# Patient Record
Sex: Female | Born: 1935 | Race: White | Hispanic: No | Smoking: Never smoker
Health system: Southern US, Community
[De-identification: ages and names within clinical notes are randomized; demographics above are authoritative.]

## PROBLEM LIST (undated history)

## (undated) DIAGNOSIS — F319 Bipolar disorder, unspecified: Secondary | ICD-10-CM

## (undated) DIAGNOSIS — K219 Gastro-esophageal reflux disease without esophagitis: Secondary | ICD-10-CM

## (undated) DIAGNOSIS — M1990 Unspecified osteoarthritis, unspecified site: Secondary | ICD-10-CM

## (undated) DIAGNOSIS — F0390 Unspecified dementia without behavioral disturbance: Secondary | ICD-10-CM

## (undated) DIAGNOSIS — F209 Schizophrenia, unspecified: Secondary | ICD-10-CM

## (undated) DIAGNOSIS — I872 Venous insufficiency (chronic) (peripheral): Secondary | ICD-10-CM

## (undated) DIAGNOSIS — E785 Hyperlipidemia, unspecified: Secondary | ICD-10-CM

## (undated) DIAGNOSIS — G20 Parkinson's disease: Secondary | ICD-10-CM

## (undated) DIAGNOSIS — M810 Age-related osteoporosis without current pathological fracture: Secondary | ICD-10-CM

## (undated) DIAGNOSIS — R251 Tremor, unspecified: Secondary | ICD-10-CM

## (undated) DIAGNOSIS — E119 Type 2 diabetes mellitus without complications: Secondary | ICD-10-CM

## (undated) DIAGNOSIS — F419 Anxiety disorder, unspecified: Secondary | ICD-10-CM

## (undated) DIAGNOSIS — E876 Hypokalemia: Secondary | ICD-10-CM

## (undated) DIAGNOSIS — D649 Anemia, unspecified: Secondary | ICD-10-CM

## (undated) DIAGNOSIS — G309 Alzheimer's disease, unspecified: Secondary | ICD-10-CM

## (undated) DIAGNOSIS — I10 Essential (primary) hypertension: Secondary | ICD-10-CM

## (undated) DIAGNOSIS — F0280 Dementia in other diseases classified elsewhere without behavioral disturbance: Secondary | ICD-10-CM

## (undated) DIAGNOSIS — K5900 Constipation, unspecified: Secondary | ICD-10-CM

## (undated) HISTORY — DX: Venous insufficiency (chronic) (peripheral): I87.2

## (undated) HISTORY — DX: Alzheimer's disease, unspecified: G30.9

## (undated) HISTORY — DX: Dementia in other diseases classified elsewhere, unspecified severity, without behavioral disturbance, psychotic disturbance, mood disturbance, and anxiety: F02.80

## (undated) HISTORY — DX: Anemia, unspecified: D64.9

## (undated) HISTORY — DX: Parkinson's disease: G20

## (undated) HISTORY — DX: Type 2 diabetes mellitus without complications: E11.9

## (undated) HISTORY — DX: Gastro-esophageal reflux disease without esophagitis: K21.9

## (undated) HISTORY — DX: Unspecified osteoarthritis, unspecified site: M19.90

## (undated) HISTORY — DX: Anxiety disorder, unspecified: F41.9

## (undated) HISTORY — DX: Hypokalemia: E87.6

## (undated) HISTORY — DX: Constipation, unspecified: K59.00

## (undated) HISTORY — DX: Schizophrenia, unspecified: F20.9

## (undated) HISTORY — DX: Bipolar disorder, unspecified: F31.9

## (undated) HISTORY — DX: Age-related osteoporosis without current pathological fracture: M81.0

## (undated) HISTORY — DX: Tremor, unspecified: R25.1

## (undated) HISTORY — DX: Hyperlipidemia, unspecified: E78.5

---

## 2000-06-14 ENCOUNTER — Emergency Department (HOSPITAL_COMMUNITY): Admission: EM | Admit: 2000-06-14 | Discharge: 2000-06-14 | Payer: Self-pay | Admitting: Emergency Medicine

## 2003-01-30 ENCOUNTER — Ambulatory Visit (HOSPITAL_COMMUNITY): Admission: RE | Admit: 2003-01-30 | Discharge: 2003-01-30 | Payer: Self-pay | Admitting: Internal Medicine

## 2003-01-30 ENCOUNTER — Emergency Department (HOSPITAL_COMMUNITY): Admission: EM | Admit: 2003-01-30 | Discharge: 2003-01-30 | Payer: Self-pay | Admitting: Emergency Medicine

## 2007-06-28 ENCOUNTER — Emergency Department (HOSPITAL_COMMUNITY): Admission: EM | Admit: 2007-06-28 | Discharge: 2007-06-28 | Payer: Self-pay | Admitting: Emergency Medicine

## 2008-02-20 ENCOUNTER — Encounter (INDEPENDENT_AMBULATORY_CARE_PROVIDER_SITE_OTHER): Payer: Self-pay | Admitting: Internal Medicine

## 2008-02-20 ENCOUNTER — Ambulatory Visit: Payer: Self-pay | Admitting: Cardiovascular Disease

## 2008-02-20 ENCOUNTER — Observation Stay (HOSPITAL_COMMUNITY): Admission: EM | Admit: 2008-02-20 | Discharge: 2008-02-21 | Payer: Self-pay | Admitting: Emergency Medicine

## 2008-06-04 ENCOUNTER — Inpatient Hospital Stay (HOSPITAL_COMMUNITY): Admission: AD | Admit: 2008-06-04 | Discharge: 2008-06-04 | Payer: Self-pay | Admitting: Obstetrics & Gynecology

## 2008-08-27 ENCOUNTER — Emergency Department (HOSPITAL_COMMUNITY): Admission: EM | Admit: 2008-08-27 | Discharge: 2008-08-27 | Payer: Self-pay | Admitting: Emergency Medicine

## 2008-09-19 ENCOUNTER — Inpatient Hospital Stay (HOSPITAL_COMMUNITY): Admission: EM | Admit: 2008-09-19 | Discharge: 2008-09-22 | Payer: Self-pay | Admitting: Emergency Medicine

## 2010-05-08 LAB — BASIC METABOLIC PANEL
BUN: 24 mg/dL — ABNORMAL HIGH (ref 6–23)
BUN: 8 mg/dL (ref 6–23)
CO2: 23 mEq/L (ref 19–32)
CO2: 23 mEq/L (ref 19–32)
CO2: 23 mEq/L (ref 19–32)
CO2: 24 mEq/L (ref 19–32)
CO2: 25 mEq/L (ref 19–32)
Calcium: 7.8 mg/dL — ABNORMAL LOW (ref 8.4–10.5)
Calcium: 7.9 mg/dL — ABNORMAL LOW (ref 8.4–10.5)
Calcium: 8 mg/dL — ABNORMAL LOW (ref 8.4–10.5)
Calcium: 8.3 mg/dL — ABNORMAL LOW (ref 8.4–10.5)
Chloride: 108 mEq/L (ref 96–112)
Chloride: 112 mEq/L (ref 96–112)
Chloride: 119 mEq/L — ABNORMAL HIGH (ref 96–112)
Chloride: 120 mEq/L — ABNORMAL HIGH (ref 96–112)
Creatinine, Ser: 1 mg/dL (ref 0.4–1.2)
Creatinine, Ser: 1.05 mg/dL (ref 0.4–1.2)
Creatinine, Ser: 1.13 mg/dL (ref 0.4–1.2)
GFR calc Af Amer: 38 mL/min — ABNORMAL LOW (ref >60)
GFR calc Af Amer: 50 mL/min — ABNORMAL LOW (ref >60)
GFR calc Af Amer: 56 mL/min — ABNORMAL LOW (ref >60)
GFR calc Af Amer: >60 mL/min (ref >60)
GFR calc Af Amer: >60 mL/min (ref >60)
GFR calc Af Amer: >60 mL/min (ref >60)
GFR calc Af Amer: >60 mL/min (ref >60)
GFR calc non Af Amer: 31 mL/min — ABNORMAL LOW (ref >60)
GFR calc non Af Amer: 36 mL/min — ABNORMAL LOW (ref >60)
GFR calc non Af Amer: 38 mL/min — ABNORMAL LOW (ref >60)
GFR calc non Af Amer: 41 mL/min — ABNORMAL LOW (ref >60)
GFR calc non Af Amer: 50 mL/min — ABNORMAL LOW (ref >60)
GFR calc non Af Amer: 53 mL/min — ABNORMAL LOW (ref >60)
Glucose, Bld: 234 mg/dL — ABNORMAL HIGH (ref 70–99)
Glucose, Bld: 254 mg/dL — ABNORMAL HIGH (ref 70–99)
Glucose, Bld: 332 mg/dL — ABNORMAL HIGH (ref 70–99)
Glucose, Bld: 361 mg/dL — ABNORMAL HIGH (ref 70–99)
Potassium: 2.9 mEq/L — ABNORMAL LOW (ref 3.5–5.1)
Potassium: 3.4 mEq/L — ABNORMAL LOW (ref 3.5–5.1)
Potassium: 4.2 mEq/L (ref 3.5–5.1)
Potassium: 4.4 mEq/L (ref 3.5–5.1)
Potassium: 4.6 mEq/L (ref 3.5–5.1)
Sodium: 136 mEq/L (ref 135–145)
Sodium: 138 mEq/L (ref 135–145)
Sodium: 141 mEq/L (ref 135–145)
Sodium: 141 mEq/L (ref 135–145)
Sodium: 148 mEq/L — ABNORMAL HIGH (ref 135–145)
Sodium: 149 mEq/L — ABNORMAL HIGH (ref 135–145)
Sodium: 150 mEq/L — ABNORMAL HIGH (ref 135–145)
Sodium: 153 mEq/L — ABNORMAL HIGH (ref 135–145)
Sodium: 155 mEq/L — ABNORMAL HIGH (ref 135–145)

## 2010-05-08 LAB — TSH: TSH: 2.681 u[IU]/mL (ref 0.350–4.500)

## 2010-05-08 LAB — GLUCOSE, CAPILLARY
Glucose-Capillary: 154 mg/dL — ABNORMAL HIGH (ref 70–99)
Glucose-Capillary: 180 mg/dL — ABNORMAL HIGH (ref 70–99)
Glucose-Capillary: 183 mg/dL — ABNORMAL HIGH (ref 70–99)
Glucose-Capillary: 214 mg/dL — ABNORMAL HIGH (ref 70–99)
Glucose-Capillary: 220 mg/dL — ABNORMAL HIGH (ref 70–99)
Glucose-Capillary: 251 mg/dL — ABNORMAL HIGH (ref 70–99)
Glucose-Capillary: 269 mg/dL — ABNORMAL HIGH (ref 70–99)
Glucose-Capillary: 278 mg/dL — ABNORMAL HIGH (ref 70–99)
Glucose-Capillary: 280 mg/dL — ABNORMAL HIGH (ref 70–99)
Glucose-Capillary: 294 mg/dL — ABNORMAL HIGH (ref 70–99)
Glucose-Capillary: 294 mg/dL — ABNORMAL HIGH (ref 70–99)
Glucose-Capillary: 337 mg/dL — ABNORMAL HIGH (ref 70–99)
Glucose-Capillary: 341 mg/dL — ABNORMAL HIGH (ref 70–99)
Glucose-Capillary: 349 mg/dL — ABNORMAL HIGH (ref 70–99)
Glucose-Capillary: 351 mg/dL — ABNORMAL HIGH (ref 70–99)
Glucose-Capillary: 362 mg/dL — ABNORMAL HIGH (ref 70–99)
Glucose-Capillary: 363 mg/dL — ABNORMAL HIGH (ref 70–99)
Glucose-Capillary: 390 mg/dL — ABNORMAL HIGH (ref 70–99)
Glucose-Capillary: 444 mg/dL — ABNORMAL HIGH (ref 70–99)

## 2010-05-08 LAB — COMPREHENSIVE METABOLIC PANEL
ALT: 12 U/L (ref 0–35)
AST: 14 U/L (ref 0–37)
Alkaline Phosphatase: 124 U/L — ABNORMAL HIGH (ref 39–117)
CO2: 28 mEq/L (ref 19–32)
Calcium: 9.1 mg/dL (ref 8.4–10.5)
GFR calc Af Amer: 35 mL/min — ABNORMAL LOW (ref >60)
GFR calc non Af Amer: 29 mL/min — ABNORMAL LOW (ref >60)
Potassium: 5.3 mEq/L — ABNORMAL HIGH (ref 3.5–5.1)
Sodium: 154 mEq/L — ABNORMAL HIGH (ref 135–145)
Total Protein: 7 g/dL (ref 6.0–8.3)

## 2010-05-08 LAB — URINALYSIS, ROUTINE W REFLEX MICROSCOPIC
Glucose, UA: 100 mg/dL — AB
Ketones, ur: 15 mg/dL — AB
Protein, ur: 30 mg/dL — AB

## 2010-05-08 LAB — DIFFERENTIAL
Eosinophils Absolute: 0.7 10*3/uL (ref 0.0–0.7)
Eosinophils Relative: 6 % — ABNORMAL HIGH (ref 0–5)
Lymphs Abs: 3.4 10*3/uL (ref 0.7–4.0)
Monocytes Relative: 7 % (ref 3–12)

## 2010-05-08 LAB — CBC
Hemoglobin: 10.5 g/dL — ABNORMAL LOW (ref 12.0–15.0)
MCHC: 32.4 g/dL (ref 30.0–36.0)
RBC: 3.56 MIL/uL — ABNORMAL LOW (ref 3.87–5.11)
RBC: 4.39 MIL/uL (ref 3.87–5.11)
WBC: 9.9 10*3/uL (ref 4.0–10.5)

## 2010-05-08 LAB — PROTIME-INR
Prothrombin Time: 13.8 seconds (ref 11.6–15.2)
Prothrombin Time: 15.3 seconds — ABNORMAL HIGH (ref 11.6–15.2)

## 2010-05-08 LAB — LIPID PANEL
Cholesterol: 187 mg/dL (ref 0–200)
Total CHOL/HDL Ratio: 4.7 RATIO

## 2010-05-08 LAB — URINE MICROSCOPIC-ADD ON

## 2010-05-08 LAB — VALPROIC ACID LEVEL: Valproic Acid Lvl: 43.6 ug/mL — ABNORMAL LOW (ref 50.0–100.0)

## 2010-05-08 LAB — CARDIAC PANEL(CRET KIN+CKTOT+MB+TROPI)
CK, MB: 1.5 ng/mL (ref 0.3–4.0)
Total CK: 142 U/L (ref 7–177)

## 2010-05-11 LAB — WET PREP, GENITAL: Clue Cells Wet Prep HPF POC: NONE SEEN

## 2010-05-17 LAB — BASIC METABOLIC PANEL
BUN: 24 mg/dL — ABNORMAL HIGH (ref 6–23)
BUN: 59 mg/dL — ABNORMAL HIGH (ref 6–23)
Chloride: 112 mEq/L (ref 96–112)
Creatinine, Ser: 0.65 mg/dL (ref 0.4–1.2)
GFR calc non Af Amer: >60 mL/min (ref >60)
GFR calc non Af Amer: >60 mL/min (ref >60)
Glucose, Bld: 166 mg/dL — ABNORMAL HIGH (ref 70–99)
Potassium: 3.7 mEq/L (ref 3.5–5.1)
Potassium: 3.8 mEq/L (ref 3.5–5.1)
Sodium: 147 mEq/L — ABNORMAL HIGH (ref 135–145)

## 2010-05-17 LAB — DIFFERENTIAL
Eosinophils Absolute: 0.2 10*3/uL (ref 0.0–0.7)
Eosinophils Relative: 2 % (ref 0–5)
Lymphocytes Relative: 35 % (ref 12–46)
Lymphs Abs: 3.8 10*3/uL (ref 0.7–4.0)
Monocytes Absolute: 0.6 10*3/uL (ref 0.1–1.0)
Monocytes Relative: 6 % (ref 3–12)

## 2010-05-17 LAB — CBC
HCT: 34.7 % — ABNORMAL LOW (ref 36.0–46.0)
Hemoglobin: 11.2 g/dL — ABNORMAL LOW (ref 12.0–15.0)
MCV: 88.8 fL (ref 78.0–100.0)
WBC: 10.7 10*3/uL — ABNORMAL HIGH (ref 4.0–10.5)

## 2010-05-17 LAB — POCT CARDIAC MARKERS
CKMB, poc: <1 ng/mL — ABNORMAL LOW (ref 1.0–8.0)
CKMB, poc: <1 ng/mL — ABNORMAL LOW (ref 1.0–8.0)
Myoglobin, poc: 125 ng/mL (ref 12–200)
Troponin i, poc: <0.05 ng/mL (ref 0.00–0.09)
Troponin i, poc: <0.05 ng/mL (ref 0.00–0.09)

## 2010-06-15 NOTE — H&P (Signed)
NAME:  Marisa Scott, Marisa Scott NO.:  [id number]   MEDICAL RECORD NO.:  [id number]          PATIENT TYPE:  EMS   LOCATION:  MAJO                         FACILITY:  [facility]   PHYSICIAN:  Eduard Clos, MDDATE OF BIRTH:  1929/11/29   DATE OF ADMISSION:  09/19/2008  DATE OF DISCHARGE:                              HISTORY & PHYSICAL   PRIMARY CARE PHYSICIAN:  Dr. Lenon Curt. Chilton Si, [employer].   CHIEF COMPLAINT:  Most of the history is obtained from the emergency  room physician, as the patient is a poor candidate for any history.  The  chief complaint is abnormal labs.   HISTORY OF PRESENT ILLNESS:  This 77 year old female with a history of  advanced dementia, hyperlipidemia, diabetes mellitus, type 2 and  schizophrenia was brought into the emergency room.  The patient was  found to have a high sodium of 154, which was recently done, as the  patient had followup with her primary care physician.  This was  reconfirmed in the emergency room, which was found to be around 153.  The patient is already started on IV fluids.  Initially her potassium  was found to be around 5.24, which Kayexalate was given, along with some  insulin and D-50.  Presently the potassium is about 4.4.  The patient  has been admitted for further evaluation and management of her  dehydration.   As the patient has advanced dementia, she does not provide any history.  She does follow commands.  There is no note mentioning any diarrhea or  abdominal pain, nausea, vomiting, fever or chills.  No loss of  consciousness.  The patient will be admitted for IV hydration and close  observation.   PAST MEDICAL HISTORY:  1. Advanced dementia.  2. Schizophrenia.  3. Bipolar disorder.  4. Anxiety disorder.  5. Hypertension.  6. Diabetes mellitus, type 2.   MEDICATIONS PRIOR TO ADMISSION:  1. Lotensin 20 mg p.o. daily.  2. Free water four times daily.  3. Depakote 750 in the morning and 1000at  bedtime.  4. Abilify 20 mg p.o. daily in the a.m.  5. Os-Cal-D 500 mg p.o. b.i.d.  6. Namenda 10 mg p.o. b.i.d.  7. Primidone 50 mg p.o. t.i.d.  8. ProCel two scoops t.i.d.  9. Aricept 10 mg p.o. at bedtime.  10.Zocor 40 mg p.o. daily.  11.Zantac 25 mg p.o. at bedtime.  12.Lantus insulin 40 units at bedtime.  13.Forteo 20 mcg daily.  14.NovoLog sliding scale.  15.Klonopin 0.25 mg p.o. b.i.d.   ALLERGIES:  NO KNOWN DRUG ALLERGIES.   SOCIAL HISTORY:  The patient has not history of tobacco abuse or drug  abuse or alcohol abuse.   FAMILY HISTORY:  Not obtainable at this time.   REVIEW OF SYSTEMS:  The patient is a poor historian.  Has advanced  dementia.   PHYSICAL EXAMINATION:  GENERAL:  The patient is not in acute distress  and follows commands.  Has tremors.  VITAL SIGNS:  Blood pressure 130/60, pulse 90 per minute, temperature  97.8 degrees, respirations 18 per minute, O2 saturation 98%.  HEENT:  Normocephalic and atraumatic.  Anicteric.  LUNGS:  Bilaterally clear to auscultation.  No rhonchi, no crepitation.  HEART:  S1, S2 heard.  ABDOMEN:  Soft, nontender.  bowel sounds heard.  CNS:  The patient is alert and awake.  Follows commands.  Moves upper  and lower extremities.  EXTREMITIES:  Peripheral pulses full.  No edema.   LABORATORY DATA:  CBC:  WBC 11.3, hemoglobin 14.5, hematocrit 38.7,  platelets 231.  Glucose 367.  Metabolic panel:  Sodium 153, potassium  4.4, chloride 133, carbon dioxide 21, glucose 367, BUN 59, creatinine  1.6.  Alkaline phosphatase 124, AST 14, ALT 12, total protein 7, albumin  3, calcium 9.  Urinalysis shows glucose 100, ketones 15.  The patient's  anion gap is 9.  Nitrites in the urine are negative.  Leukocytes are  negative in the urine, WBCs 0.   ASSESSMENT:  1. Severe dehydration.  2. Hypernatremia, secondary to dehydration.  3. History of schizophrenia, bipolar disorder, anxiety.  4. History of hypertension.  5. History of  hyperlipidemia.   PLAN:  1. Will admit the patient to telemetry.  Initially the patient was      found to have hyperkalemia, which is corrected.  2. Will continue with IV fluids, half normal saline at 200 mL.  The      patient is to have sodium and chloride now getting in character      with half normal.  Will have to change the D-5-W with increased      insulin requirement.  3. Will continue most of her medications at this time.  4. Will check BMETs every six hours.  Further recommendations as her      condition evolves.  If the patient's sodium gets corrected, the      patient may be able to go to a nursing home in the next 24 hours.      Eduard Clos, MD  Electronically Signed     ANK/MEDQ  D:  09/19/2008  T:  09/19/2008  Job:  [id number]   cc:   Lenon Curt. Chilton Si, M.D.

## 2010-06-15 NOTE — H&P (Signed)
NAME:  Marisa Scott, Marisa Scott NO.:  [id number]   MEDICAL RECORD NO.:  [id number]          PATIENT TYPE:  INP   LOCATION:  0101                         FACILITY:  [facility]   PHYSICIAN:  Vania Rea, M.D. DATE OF BIRTH:  1929-12-21   DATE OF ADMISSION:  02/20/2008  DATE OF DISCHARGE:                              HISTORY & PHYSICAL   PRIMARY CARE PHYSICIAN:  Dr. Merton Border Green/Dr. __________ at [facility].   CHIEF COMPLAINT:  History of syncope.   HISTORY OF PRESENT ILLNESS:  This is a 77 year old Caucasian lady with a  history of schizophrenia and advanced dementia and other medical  problems such as diabetes, who is a resident of a skilled nursing  facility.  She is usually medically stable, mobile by wheelchair.  Her  baseline is alert, but not oriented to place nor time.  Recently, the  patient was found to be markedly dehydrated, reportedly with a BUN  elevated over 90 and has been getting hydration.  She has therefore been  on IV fluids and has been getting checked q. shift at the skilled  nursing facility.  Nurses report that at the change of shift at about  11:40 p.m. last night, the new nurses went to check on her and found her  unresponsive, not breathing and without a pulse.  A code was called and  after 2 minutes of CPR, the patient began to stir and was moving her  arms.  Her vitals were checked at that time and she was found to have a  temperature of 97.4, a pulse of 82, respiration of 20 and a blood  pressure of 133/85.  When EMS arrived, they found the patient alert and  no signs of distress, but because of a report of 2 minutes of  unresponsiveness requiring CPR, the patient was transferred to the  emergency room.  The patient has been evaluated by the emergency room  physician.  No acute abnormalities have been found to explain her  syncope and the hospitalist service has been called to assist with  management.   PAST MEDICAL  HISTORY:  The patient is completely demented and unable to  contribute to the history.  She complains only of thirst.  Denies any  other problems.  Skilled nursing facility other than reporting that her  BUN/creatinine ratio is markedly elevated deny any other problems.   PAST MEDICAL HISTORY:  1. Schizophrenia.  2. Bipolar disorder.  3. Dementia.  4. Hyperlipidemia.  5. Diabetes type 2.  6. Osteoporosis.  7. Dehydration.   MEDICATIONS:  1. Abilify 35 mg daily.  2. Lasix 20 mg daily.  3. Benazepril 20 mg daily.  4. Senokot-S 2 tablets daily.  5. Multivitamins with mineral 1 daily.  6. Forteo 20 mcg subcu daily.  7. Depakote extended-release 750 mg each morning and 1000 mg at      bedtime.  8. Klonopin 0.25 mg twice daily.  9. Os-Cal with D 1 tablet twice daily.  10.Namenda 10 mg twice daily.  11.Aricept 10 mg at bedtime.  12.Primidone 50 mg three times daily.  13.Zocor  40 mg at bedtime.  14.Perphenazine 12 mg at bedtime.  15  Zantac 75 mg at bedtime.  1. Glucophage XR 1000 mg twice daily.  2. Lantus 35 units each morning.  3. Med Pass 90 mL three times daily.   ALLERGIES:  NO KNOWN DRUG ALLERGIES.   SOCIAL HISTORY:  No known history of alcohol, tobacco or illicit drug  use.  She is divorced.  She is a long-term resident of a current  facility.   FAMILY HISTORY:  We are unable to obtain.   REVIEW OF SYSTEMS:  Other than noted above, significant only for a past  history of tinea cruris, incontinence of urine, history of  hypernatremia, history of organic brain syndrome, history of tardive  dyskinesia, vasomotor rhinitis, GERD, constipation.  Past history of  psoriasis, osteoarthrosis of the knee, history of generalized pain, past  history of edema.   PHYSICAL EXAMINATION:  GENERAL:  A frail elderly Caucasian lady lying on  the stretcher, mouth open and dry, initially unresponsive when first  approached, but with stimuli arouses and is talkative, but is not  oriented  to place nor time, but sufficiently oriented to ask me if I am  the doctor.  VITAL SIGNS:  Her temperature is 97.7, pulses 91, respirations 18, blood  pressure 118/59.  She is saturating 100% on 2 liters.  She denies any  pain.  HEENT:  Her pupils are round and equal.  Mucous membranes pink and dry.  No oral lesions.  Her mouth oral mucosa is extremely dry.  NECK:  No cervical lymphadenopathy or thyromegaly.  CHEST:  Clear to auscultation bilaterally.  CARDIOVASCULAR:  Regular rhythm.  She has a 3/6 systolic murmur heard  all over the precordium and radiating through to her back.  ABDOMEN:  Soft and nontender.  There are no masses.  EXTREMITIES:  Without edema.  She has 2+ dorsalis pedis pulses  bilaterally.  She has bony deformity of both knees and bowing of the  tibia.  CENTRAL NERVOUS SYSTEM:  Cranial nerves II-XII are grossly intact.  She  has no lateralizing signs.   LABORATORY DATA:  CBC; her white count is slightly elevated 10.7,  hemoglobin is 11.2, which is up from a previous value earlier this  month.  Her platelets are 180.  She has a normal differential on her  white count.  Serum chemistry; her sodium is 147, potassium 3.7,  chloride 112, CO2 of 26, BUN 59, creatinine 0.88, glucose 111, calcium  8.8, beta type natriuretic peptide is 32.5.  Her cardiac enzymes are  remarkably normal for somebody who has had CPR.  Her CK-MB is  undetectable.  Her troponin is undetectable.  Her myoglobin is 125.  Point of care markers are within that same range.  Chest x-ray shows  mild to moderate bronchitic changes.  There are no acute infiltrates or  effusions.  EKG shows normal sinus rhythm with a rate of 87, and there  are no ST-segment changes to suggest ischemia.   ASSESSMENT:  1. Dehydration as assessed by clinical appearance and elevated      BUN/creatinine ratio.  2. Hypernatremia associated with dehydration  3. History of transient syncope, questionable true syncope versus  the      patient in deep sleep.  4. Diabetes type 2, apparent fair control.  5. History of hypertension with fair control.  6. History of osteoporosis.  7. Advanced dementia.  8. History of schizoaffective disorder.   PLAN:  Will admit this lady  for observation.  Will hydrate her with free  water while she is in hospital.  Will check a 2-D echo because of her  cardiac murmur.  Will monitor her cardiac enzymes.  The chances are if  this patient remains stable over the next 24 hours, she can be returned  to the emergency room with a presumed diagnosis of pseudosyncope.  Other  plans as per orders.      Vania Rea, M.D.  Electronically Signed     LC/MEDQ  D:  02/20/2008  T:  02/20/2008  Job:  [id number]

## 2010-06-15 NOTE — Discharge Summary (Signed)
NAME:  Marisa Scott, Marisa Scott NO.:  [id number]   MEDICAL RECORD NO.:  [id number]          PATIENT TYPE:  INP   LOCATION:  5524                         FACILITY:  [facility]   PHYSICIAN:  Richarda Overlie, MD       DATE OF BIRTH:  [birth year]   DATE OF ADMISSION:  09/19/2008  DATE OF DISCHARGE:                               DISCHARGE SUMMARY   DISCHARGE DIAGNOSIS:  1. Hypernatremia.  2. Dehydration secondary to decreased free water intake  3. Acute on chronic renal insufficiency.  4. Mild diabetic ketoacidosis.  5. Dementia.  6. Schizophrenia.  7. Bipolar disorder.  8. Anxiety disorder.  9. Uncontrolled diabetes.   DISCHARGE MEDICATIONS:  1. Abilify 20 mg p.o. daily.  2. Lotensin 20 mg p.o. daily.  3. Os-Cal 500 one tablet p.o. b.i.d.  4. Klonopin 0.25 mg p.o. twice a day.  5. Depakote 750 mg p.o. daily in the morning and 1000 mg p.o. daily at      bedtime.  6. Aricept 10 mg p.o. daily.  7. Free water 240 mL p.o. q.4 h.  8. NovoLog sliding scale.  9. Lantus 40 units twice a day.  10.Namenda 10 mg p.o. b.i.d.  11.Primidone 50 mg p.o. t.i.d.  12.Florastor 250 mg p.o. b.i.d.  13.Zocor 40 mg p.o. in the evening.  14.Tylenol 650 p.o. q.6 h. p.r.n.  15.Protein supplement 2 scoops 3 times a day.   PROCEDURES:  Chest x-ray portable shows left basal atelectasis.   LABORATORY DATA:  Lipid panel shows triglycerides of 287, LDL 90, HDL  40, TSH of 2.681.   Cardiac enzymes negative.   SUBJECTIVE:  This is a 77 year old female with a history of advanced  dementia, dyslipidemia, schizophrenia, anxiety disorder, type 2  diabetes, who presents to the ER with a chief complaint of altered  mental status and electrolyte abnormalities.  The patient apparently had  an elevated potassium of about 5.24 at the nursing home.  She was also  found to have sodium of 154.  She was also found to be confused at the  nursing home.  The patient was sent to the ER for further evaluation and  for  treatment of her electrolyte abnormalities.   HOSPITAL COURSE:  1. Electrolyte abnormalities.  The patient was found to have a CBG of      367, a potassium of about 5.24, creatinine of 1.61, and a sodium of      155 at the time of initial admission.  The patient initially was      treated with insulin, D50 and Kayexalate for her elevated      potassium.  The patient was also started on treatment with D5W for      her hypernatremia.  No obvious exacerbating cause was found.  It      was thought that the patient's significant electrolyte      abnormalities were probably present in the setting of her mild DKA.      The patient was treated for mild DKA and electrolyte abnormalities,      continued to improve; in fact, the patient's potassium came  down to      2.9 during the course of her hospitalization and potassium      supplementation was required.  The patient's sodium also improved      to about 141 on D5W half normal saline.  This was thought to be      secondary to free water deficit, and the parameters for free water      intake at the nursing home have been changed from 240 mL 3 times a      day to 240 mL 6 times a day.  2. Diabetes.  The patient was found to be in mild DKA with a mildly      low bicarb, presence of ketones on  urinalysis as well as a      borderline high anion gap.  The patient was placed on a glucose      stabilizer drip, following which her serial blood sugars improved.      Her Lantus is being increased to 40 units twice a day.  She will      continue on the NovoLog sliding scale as previously.  No obvious      exacerbating cause of her electrolyte abnormalities/mild DKA was      found.  The patient did not have any underlying signs of infection      despite a mildly-elevated white count of 11.3 at the time of      admission.  A urinalysis was done and was found to be negative.  A      chest x-ray was also found to be negative for any underlying      pneumonia.   The patient was initiated on a mechanical soft nectar-      thick diet with q.a.c. and q.h.s. coverage.  3. Underlying dementia, schizophrenia and anxiety disorder.  The      patient was continued on Abilify, Namenda, Aricept as previously      and no changes are being made.   FOLLOW-UP CONCERNS:  The patient's water intake needs to be optimized,  and this is being increased from 3 times a day to 6 times a day.  The  patient would also need a repeat BMP panel in about 5-7 days and also a  fasting glucose and a hemoglobin A1c in 3 months.      Richarda Overlie, MD  Electronically Signed     NA/MEDQ  D:  09/21/2008  T:  09/21/2008  Job:  [id number]

## 2010-06-15 NOTE — Discharge Summary (Signed)
NAME:  Marisa Scott, Marisa Scott NO.:  [id number]   MEDICAL RECORD NO.:  [id number]          PATIENT TYPE:  INP   LOCATION:  1428                         [facility]ACILITY[facility]   PHYSICIAN:  Theodosia Paling, MD    DATE OF BIRTH:  03-20-1929   DATE OF ADMISSION:  02/20/2008  DATE OF DISCHARGE:  02/21/2008                               DISCHARGE SUMMARY   PRIMARY CARE PHYSICIAN:  Dr. Murray Hodgkins at [facility].   ADMITTING HISTORY:  Please refer to the excellent admission note  dictated by Dr. Vania Rea dated February 20, 2008.   DISCHARGE DIAGNOSES:  1. Dehydration.  2. History of apparent cardiac arrest and resuscitation, 2 minutes      resuscitation prior to the discharge.  3. Diabetes mellitus.  4. Hypernatremia.  5. Hypertension.  6. Dementia.  7. Schizoaffective disorder.   DISCHARGE MEDICATIONS:  1. Abilify 35 mg p.o. daily.  2. Lasix 20 mg p.o. daily.  3. Benazepril 20 mg p.o. daily.  4. Senokot S 2 tablets p.o. daily.  5. Multivitamins and minerals one tab p.o. daily.  6. Forteo 20 mcg subcutaneous daily.  7. Depakote extended release 750 mg each morning and 1000 mg at      bedtime.  8. Klonopin 0.25 mg p.o. q.12 hours.  9. Os-Cal with D 1 tablet twice daily.  10.Namenda 10 mg p.o. q. 12 hours.  11.Aricept 10 mg p.o. nightly.  12.Primidone 50 mg p.o. q.8 hours.  13.Zocor 40 mg p.o. nightly.  14.Perphenazine 12 mg at bedtime.  15.Zantac 75 mg p.o. nightly.  16.Glucophage XR 1000 mg p.o. q. 12 hours.  17.Lantus 35 units each morning.  18.MedPass 90 mL p.o. q.8 hours.   HOSPITAL COURSE:  The following issues were addressed during  hospitalization:  1. Dehydration.  According to the admitting physician assessment the      patient appeared very dehydrated, had elevated BUN/creatinine      ratio.  She received hydration.  At the time of discharge      clinically she looks well, hydrated, has no electrolyte or renal      failure  associated with dehydration.  2. History of questionable cardiac arrest.  The patient was brought to      the hospital after 2 minutes of cardiac arrest.  She was admitted      under telemetry.  She did not go to the MICU for admission and she      was hemodynamically stable at the time of admission and Dr.      Orvan Falconer did not feel any need for patient to be monitored in the      intensive care given her normal vitals.  She was admitted in      telemetry which did not show any evidence of her arrhythmia.  She      stayed in normal sinus rhythm.  She received an echocardiogram      which showed normal ejection fraction and no wall motion defects.  3. Diabetes, stayed controlled.  4. Hypertension, stayed controlled.  5. Dementia, stable.  6. Schizoaffective  disorder, psychotropic medications continued.   CONSULTATION PERFORMED:  None.   PROCEDURE PERFORMED:  None.   Chest x-ray performed on February 20, 2008:  Mild-to-moderate bronchitis  changes.  Echocardiogram performed on July 20, 2008:  Showing overall  ejection fraction of left ventricle 60%, mild aortic valve stenosis.   Thank you so much for this dictation.  Total time spent in discharge of  the patient 45 minutes.      Theodosia Paling, MD  Electronically Signed     NP/MEDQ  D:  02/21/2008  T:  02/21/2008  Job:  [id number]   cc:   Lenon Curt. Chilton Si, M.D.  Fax: [phone]

## 2010-06-15 NOTE — Discharge Summary (Signed)
NAME:  Marisa Scott, BLANE NO.:  [id number]   MEDICAL RECORD NO.:  [id number]          PATIENT TYPE:  INP   LOCATION:  5524                         FACILITY:  [facility]   PHYSICIAN:  Richarda Overlie, MD       DATE OF BIRTH:  12/08/1929   DATE OF ADMISSION:  09/19/2008  DATE OF DISCHARGE:  09/22/2008                               DISCHARGE SUMMARY   DISCHARGE DIAGNOSIS/DISCHARGE MEDICATION LIST:  Joneen Roach see discharge  diagnosis and final discharge medication list from discharge summary  done on September 21, 2008.  No changes are being made at this time.   HOSPITAL COURSE OVERNIGHT:  The patient has been hemodynamically stable.   VITAL SIGNS AT THE TIME OF DISCHARGE:  130/63 blood pressure,  respirations 22, pulse 73, temperature 98.0.  GENERAL:  Alert but mildly confused, otherwise in no acute distress.  HEENT:  Pupils equal and reactive.  Extraocular movements intact.  LUNGS:  Clear to auscultation bilaterally.  No wheezes, no crackles, no  rhonchi.  CARDIOVASCULAR:  Regular rate and rhythm.  No appreciable murmurs, rubs  or gallops.  ABDOMEN:  Soft, nontender, nondistended.  NEUROLOGIC:  Cranial nerves II-XII grossly intact.   LABS:  Sodium 138, potassium 4.6, chloride 1.2, bicarbonate 20, glucose  254, BUN 8, creatinine 1.01.   ASSESSMENT AND PLAN:  1. Mild diabetic ketoacidosis.  This has resolved for the last 2 days.      The patient's Lantus has been increased to 40 units twice a day.      She is to continue with Accu-Cheks before meals and nightly.  She      will need a repeat CMP panel along with magnesium and a phosphorus      check in about for 4 days to make sure that her electrolyte      abnormalities stay resolved.  2. Dehydration and hypernatremia.  This has resolved and the patient's      sodium is stable.  Would encourage continued increase in free water      intake as was initiated in the hospital.  Otherwise, the patient is      stable at this time for  discharge.      Richarda Overlie, MD  Electronically Signed     NA/MEDQ  D:  09/22/2008  T:  09/22/2008  Job:  [id number]

## 2011-12-30 ENCOUNTER — Encounter (HOSPITAL_COMMUNITY): Payer: Self-pay | Admitting: Emergency Medicine

## 2011-12-30 ENCOUNTER — Emergency Department (HOSPITAL_COMMUNITY)
Admission: EM | Admit: 2011-12-30 | Discharge: 2011-12-31 | Disposition: A | Discharge status: Home / Self Care | Payer: Medicare Other | Attending: Emergency Medicine | Admitting: Emergency Medicine

## 2011-12-30 ENCOUNTER — Emergency Department (HOSPITAL_COMMUNITY): Payer: Medicare Other

## 2011-12-30 DIAGNOSIS — X58XXXA Exposure to other specified factors, initial encounter: Secondary | ICD-10-CM | POA: Insufficient documentation

## 2011-12-30 DIAGNOSIS — F0390 Unspecified dementia without behavioral disturbance: Secondary | ICD-10-CM | POA: Insufficient documentation

## 2011-12-30 DIAGNOSIS — Y9389 Activity, other specified: Secondary | ICD-10-CM | POA: Insufficient documentation

## 2011-12-30 DIAGNOSIS — S82402A Unspecified fracture of shaft of left fibula, initial encounter for closed fracture: Secondary | ICD-10-CM

## 2011-12-30 DIAGNOSIS — I10 Essential (primary) hypertension: Secondary | ICD-10-CM | POA: Insufficient documentation

## 2011-12-30 DIAGNOSIS — S82409A Unspecified fracture of shaft of unspecified fibula, initial encounter for closed fracture: Secondary | ICD-10-CM | POA: Insufficient documentation

## 2011-12-30 DIAGNOSIS — Z79899 Other long term (current) drug therapy: Secondary | ICD-10-CM | POA: Insufficient documentation

## 2011-12-30 DIAGNOSIS — Z794 Long term (current) use of insulin: Secondary | ICD-10-CM | POA: Insufficient documentation

## 2011-12-30 DIAGNOSIS — Y929 Unspecified place or not applicable: Secondary | ICD-10-CM | POA: Insufficient documentation

## 2011-12-30 HISTORY — DX: Unspecified dementia, unspecified severity, without behavioral disturbance, psychotic disturbance, mood disturbance, and anxiety: F03.90

## 2011-12-30 HISTORY — DX: Essential (primary) hypertension: I10

## 2011-12-30 NOTE — ED Notes (Signed)
Bed:WA12<BR> Expected date:<BR> Expected time:<BR> Means of arrival:<BR> Comments:<BR> EMS

## 2011-12-30 NOTE — ED Notes (Addendum)
Pt from [facility] c/o left foot pain. XRay done at facility confirmed left ankle fracture. Pt has no complaints. Sleeping on EMS arrival and during transport. Hx of dementia, schizophrenia, bipolar, anxiety, HTN. AAOx4 per norm. VS 130/80 HR 80 RR16 at 2300.

## 2011-12-30 NOTE — ED Notes (Signed)
No witnessed fall reported.

## 2011-12-31 NOTE — ED Notes (Signed)
[facility] called  

## 2011-12-31 NOTE — ED Notes (Addendum)
Pt has been in bed with eyes open, not verbally responsive, but alert.

## 2011-12-31 NOTE — ED Notes (Signed)
Pt back from X-ray.  

## 2011-12-31 NOTE — ED Provider Notes (Signed)
History     CSN: [id number]  Arrival date & time 12/30/11  2258   First MD Initiated Contact with Patient 12/30/11 2305      Chief Complaint  Patient presents with  . Left Ankle Fracture     (Consider location/radiation/quality/duration/timing/severity/associated sxs/prior treatment) HPI 78 year old female presents from nursing home with complaint of left ankle fracture. Patient has been complaining of left foot pain, had x-rays done at nursing facility. I spoke with the nursing staff at the facility, patient is not ambulatory, and they are unsure how she injured her ankle. Report with patient shows a spiral left fibula fracture. Patient has pain with palpation of the left ankle, but otherwise is asymptomatic. He should has dementia, schizophrenia. She has no complaints at this time  Past Medical History  Diagnosis Date  . Hypertension   . Dementia     History reviewed. No pertinent past surgical history.  No family history on file.  History  Substance Use Topics  . Smoking status: Not on file  . Smokeless tobacco: Not on file  . Alcohol Use:     OB History    Grav Para Term Preterm Abortions TAB SAB Ect Mult Living                  Review of Systems  Unable to perform ROS: Dementia    Allergies  Review of patient's allergies indicates no known allergies.  Home Medications   Current Outpatient Rx  Name  Route  Sig  Dispense  Refill  . BENAZEPRIL HCL 10 MG PO TABS   Oral   Take 10 mg by mouth daily.         Marland Kitchen CLONAZEPAM 0.5 MG PO TABS   Oral   Take 0.25 mg by mouth 2 (two) times daily as needed. anxiety         . UTI-STAT PO LIQD   Oral   Take 30 mLs by mouth every morning.         . DONEPEZIL HCL 10 MG PO TABS   Oral   Take 10 mg by mouth at bedtime.         Marland Kitchen FERROUS SULFATE 325 (65 FE) MG PO TABS   Oral   Take 325 mg by mouth daily with breakfast.         . INSULIN ASPART 100 UNIT/ML [place] SOLN   Subcutaneous   Inject 8 Units into  the skin 3 (three) times daily before meals. Sliding scale         . INSULIN GLARGINE 100 UNIT/ML [place] SOLN   Subcutaneous   Inject 40 Units into the skin every morning.         Marland Kitchen MEMANTINE HCL 10 MG PO TABS   Oral   Take 10 mg by mouth 2 (two) times daily.         Marland Kitchen ELDERTONIC PO   Oral   Take 15 mLs by mouth 3 (three) times daily.         . MULTI-VITAMIN/MINERALS PO TABS   Oral   Take 1 tablet by mouth daily.         Jeralyn Bennett CALCIUM 500 MG PO TABS   Oral   Take 500 mg of elemental calcium by mouth daily.         . INVEGA PO   Oral   Take 117 mg by mouth every 30 (thirty) days.         Frazier Butt FREE OP  Ophthalmic   Apply 1 drop to eye 3 (three) times daily.         Marland Kitchen POTASSIUM CHLORIDE CRYS ER 20 MEQ PO TBCR   Oral   Take 20 mEq by mouth daily.         Marland Kitchen PRIMIDONE 50 MG PO TABS   Oral   Take 50 mg by mouth 3 (three) times daily.         Marland Kitchen RISPERIDONE 1 MG PO TABS   Oral   Take 1 mg by mouth at bedtime.           BP 120/61  Pulse 88  Temp 98 F (36.7 C) (Oral)  Resp 18  SpO2 96%  Physical Exam  Nursing note and vitals reviewed. Constitutional: She appears well-developed and well-nourished. No distress.  Cardiovascular: Normal rate, regular rhythm, normal heart sounds and intact distal pulses.  Exam reveals no gallop and no friction rub.   No murmur heard. Pulmonary/Chest: Effort normal and breath sounds normal. No respiratory distress. She has no wheezes. She has no rales. She exhibits no tenderness.  Musculoskeletal: She exhibits tenderness (Tenderness to palpation of left ankle,lateral worse than medial without crepitus. Soft tissue swelling noted).  Skin: Skin is warm and dry. No rash noted. No erythema. No pallor.    ED Course  Procedures (including critical care time)  Labs Reviewed - No data to display Dg Ankle Complete Left  12/31/2011  *RADIOLOGY REPORT*  Clinical Data: Left ankle injury.  LEFT ANKLE COMPLETE - 3+ VIEW   Comparison: None.  Findings: Minimally displaced oblique fracture through the distal fibula present with overlying soft tissue swelling.  There is irregularity of the medial malleolus without definite acute fracture.  This may relate to prior injury.  IMPRESSION: Minimally displaced fracture of the distal fibula.  Irregularity of the medial malleolus is felt to most likely relate to old injury.   Original Report Authenticated By: Irish Lack, M.D.      1. Closed fracture of left fibula       MDM  78 year old female with distal fibula fracture. 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 Will get x-rays here, and placed in splint to followup outpatient orthopedics.        Olivia Mackie, MD 12/31/11 [phone]

## 2012-06-12 ENCOUNTER — Non-Acute Institutional Stay (SKILLED_NURSING_FACILITY): Payer: Medicare Other | Admitting: Internal Medicine

## 2012-06-12 DIAGNOSIS — E7800 Pure hypercholesterolemia, unspecified: Secondary | ICD-10-CM

## 2012-06-12 DIAGNOSIS — IMO0001 Reserved for inherently not codable concepts without codable children: Secondary | ICD-10-CM

## 2012-06-12 DIAGNOSIS — G309 Alzheimer's disease, unspecified: Secondary | ICD-10-CM

## 2012-06-12 DIAGNOSIS — I10 Essential (primary) hypertension: Secondary | ICD-10-CM

## 2012-06-12 DIAGNOSIS — F0280 Dementia in other diseases classified elsewhere without behavioral disturbance: Secondary | ICD-10-CM

## 2012-06-12 NOTE — Progress Notes (Signed)
PROGRESS NOTE  DATE: 06/12/2012  FACILITY: Nursing Home Location: [facility]  LEVEL OF CARE: SNF (31)  Routine Visit  CHIEF COMPLAINT:  Manage Alzheimer's dementia, diabetes mellitus and hypertension  HISTORY OF PRESENT ILLNESS:  REASSESSMENT OF ONGOING PROBLEM(S):  1. HTN: Pt 's HTN remains stable.  Denies CP, sob, DOE, pedal edema, headaches, dizziness or visual disturbances.  No complications from the medications currently being used.  Last BP : 130/80, 114/61, 126/79.  2. DM:pt's DM remains stable.  Pt denies polyuria, polydipsia, polyphagia, changes in vision or hypoglycemic episodes.  No complications noted from the medication presently being used.  Last hemoglobin A1c is: 7.3 in 2/14.  3. DEMENTIA: The dementia remaines stable and continues to function adequately in the current living environment with supervision.  The patient has had little changes in behavior. No complications noted from the medications presently being used.  PAST MEDICAL HISTORY : Reviewed.  No changes.  CURRENT MEDICATIONS: Reviewed per [facility]  REVIEW OF SYSTEMS:  GENERAL: no change in appetite, no fatigue, no weight changes, no fever, chills or weakness RESPIRATORY: no cough, SOB, DOE, wheezing, hemoptysis CARDIAC: no chest pain, edema or palpitations GI: no abdominal pain, diarrhea, constipation, heart burn, nausea or vomiting  PHYSICAL EXAMINATION  VS:  T 97.8       P 84     RR 20      BP     POX % 95     WT (Lb)  GENERAL: no acute distress, obese body habitus EYES: conjunctivae normal, sclerae normal, normal eye lids NECK: supple, trachea midline, no neck masses, no thyroid tenderness, no thyromegaly LYMPHATICS: no LAN in the neck, no supraclavicular LAN RESPIRATORY: breathing is even & unlabored, BS CTAB CARDIAC: RRR, no murmur,no extra heart sounds, no edema GI: abdomen soft, normal BS, no masses, no tenderness, no hepatomegaly, no splenomegaly PSYCHIATRIC: the  patient is alert & oriented to person, affect & behavior appropriate  LABS/RADIOLOGY:  2/14 MCV 76.5 otherwise CBC normal, HDL 50, LDL 179, total cholesterol 281, triglycerides 268, liver profile normal  ASSESSMENT/PLAN:  1. Alzheimer's dementia-stable. 2. diabetes mellitus-uncontrolled. Lantus was increased. 3. hypertension-well-controlled. 4. hyperlipidemia-Lipitor was started in 2/14. Recheck fasting lipid panel. 5. iron deficiency anemia-hemoglobin is normal. DC iron. Recheck CBC in one month. 6. hypokalemia -- well repleted. 7. check CMP.  CPT CODE: 60454

## 2012-06-19 ENCOUNTER — Encounter: Payer: Self-pay | Admitting: Internal Medicine

## 2012-07-06 ENCOUNTER — Encounter: Payer: Self-pay | Admitting: Internal Medicine

## 2012-07-11 ENCOUNTER — Encounter: Payer: Self-pay | Admitting: Internal Medicine

## 2012-07-11 ENCOUNTER — Ambulatory Visit (INDEPENDENT_AMBULATORY_CARE_PROVIDER_SITE_OTHER): Payer: Medicare Other | Admitting: Internal Medicine

## 2012-07-11 ENCOUNTER — Other Ambulatory Visit (INDEPENDENT_AMBULATORY_CARE_PROVIDER_SITE_OTHER): Payer: Medicare Other

## 2012-07-11 VITALS — BP 100/50 | HR 120

## 2012-07-11 DIAGNOSIS — G309 Alzheimer's disease, unspecified: Secondary | ICD-10-CM

## 2012-07-11 DIAGNOSIS — R768 Other specified abnormal immunological findings in serum: Secondary | ICD-10-CM | POA: Insufficient documentation

## 2012-07-11 DIAGNOSIS — K7460 Unspecified cirrhosis of liver: Secondary | ICD-10-CM

## 2012-07-11 DIAGNOSIS — F0280 Dementia in other diseases classified elsewhere without behavioral disturbance: Secondary | ICD-10-CM

## 2012-07-11 DIAGNOSIS — R894 Abnormal immunological findings in specimens from other organs, systems and tissues: Secondary | ICD-10-CM

## 2012-07-11 LAB — HEPATIC FUNCTION PANEL
ALT: 21 U/L (ref 0–35)
AST: 20 U/L (ref 0–37)
Alkaline Phosphatase: 97 U/L (ref 39–117)
Total Bilirubin: 0.4 mg/dL (ref 0.3–1.2)

## 2012-07-11 LAB — CBC
MCHC: 32.6 g/dL (ref 30.0–36.0)
RDW: 14.6 % (ref 11.5–14.6)
WBC: 12 10*3/uL — ABNORMAL HIGH (ref 4.5–10.5)

## 2012-07-11 LAB — PROTIME-INR
INR: 1.2 ratio — ABNORMAL HIGH (ref 0.8–1.0)
Prothrombin Time: 12.5 s — ABNORMAL HIGH (ref 10.2–12.4)

## 2012-07-11 NOTE — Patient Instructions (Addendum)
Your physician has requested that you go to the basement for lab work before leaving today.                                               We are excited to introduce MyChart, a new best-in-class service that provides you online access to important information in your electronic medical record. We want to make it easier for you to view your health information - all in one secure location - when and where you need it. We expect MyChart will enhance the quality of care and service we provide.  When you register for MyChart, you can:    View your test results.    Request appointments and receive appointment reminders via email.    Request medication renewals.    View your medical history, allergies, medications and immunizations.    Communicate with your physician's office through a password-protected site.    Conveniently print information such as your medication lists.  To find out if MyChart is right for you, please talk to a member of our clinical staff today. We will gladly answer your questions about this free health and wellness tool.  If you are age 77 or older and want a member of your family to have access to your record, you must provide written consent by completing a proxy form available at our office. Please speak to our clinical staff about guidelines regarding accounts for patients younger than age 77.  As you activate your MyChart account and need any technical assistance, please call the MyChart technical support line at [phone]) or email your question to [email or web address]. If you email your question(s), please include your name, a return phone number and the best time to reach you.  If you have non-urgent health-related questions, you can send a message to our office through MyChart at [email or web address]. If you have a medical emergency, call 911.  Thank you for using MyChart as your new health and wellness resource!   MyChart licensed  from [employer],  [id number]. Patents Pending.

## 2012-07-11 NOTE — Progress Notes (Signed)
Patient ID: Marisa Scott, female   DOB: 11/25/1929, 79 y.o.   MRN: [id number] HPI: Marisa Scott is an 79 yo female with PMH of Alzheimer's, hypertension, diabetes, hyperlipidemia who is seen in consultation at the request of Dr. Chilton Si for evaluation of a positive hepatitis C antibody. The patient presents with a healthcare worker involved with her transportation but no family representative. I contacted her skilled nursing facility and talked to [name], nurse practitioner regarding the reason for this consultation. She informed me that a staff member was involved with a needle stick concerning this patient and labs were drawn which indicated reactive hepatitis C antibody. She was sent over for this reason. They report at the nursing home no abdominal complaints, nausea, vomiting, trouble with bowel habits.  The patient is not able to provide any additional clinical details today. I asked questions but she was unable to communicate any meaningful responses.  Patient Active Problem List   Diagnosis Date Noted  . Alzheimer's disease 06/12/2012  . Type II or unspecified type diabetes mellitus without mention of complication, uncontrolled 06/12/2012  . Essential hypertension, benign 06/12/2012  . Pure hypercholesterolemia 06/12/2012    History reviewed. No pertinent past surgical history.  Current Outpatient Prescriptions  Medication Sig Dispense Refill  . atorvastatin (LIPITOR) 40 MG tablet       . benazepril (LOTENSIN) 10 MG tablet Take 10 mg by mouth daily.      . clonazePAM (KLONOPIN) 0.5 MG tablet Take 0.25 mg by mouth 2 (two) times daily as needed. anxiety      . Cranberry-Vitamin C-Inulin (UTI-STAT) LIQD Take 30 mLs by mouth every morning.      . donepezil (ARICEPT) 10 MG tablet Take 10 mg by mouth at bedtime.      . ferrous sulfate 325 (65 FE) MG tablet Take 325 mg by mouth daily with breakfast.      . insulin aspart (NOVOLOG) 100 UNIT/ML injection Inject 8 Units into the skin 3  (three) times daily before meals. Sliding scale      . insulin glargine (LANTUS) 100 UNIT/ML injection Inject 40 Units into the skin every morning.      Hinda Glatter SUSTENNA 117 MG/0.75ML SUSP       . ketoconazole (NIZORAL) 2 % cream       . memantine (NAMENDA) 10 MG tablet Take 10 mg by mouth 2 (two) times daily.      . mirtazapine (REMERON SOL-TAB) 30 MG disintegrating tablet       . Multiple Vitamins-Minerals (ELDERTONIC PO) Take 15 mLs by mouth 3 (three) times daily.      . Multiple Vitamins-Minerals (MULTIVITAMIN WITH MINERALS) tablet Take 1 tablet by mouth daily.      Ethelda Chick (OYSTER CALCIUM) 500 MG TABS Take 500 mg of elemental calcium by mouth daily.      . Paliperidone (INVEGA PO) Take 117 mg by mouth every 30 (thirty) days.      Bertram Gala Glycol-Propyl Glycol (SYSTANE FREE OP) Apply 1 drop to eye 3 (three) times daily.      . potassium chloride SA (K-DUR,KLOR-CON) 20 MEQ tablet Take 20 mEq by mouth daily.      . primidone (MYSOLINE) 50 MG tablet Take 50 mg by mouth 3 (three) times daily.      . risperiDONE (RISPERDAL) 1 MG tablet Take 1 mg by mouth at bedtime.       No current facility-administered medications for this visit.    No Known Allergies  History reviewed. No pertinent family history.  History  Substance Use Topics  . Smoking status: Never Smoker   . Smokeless tobacco: Never Used  . Alcohol Use: No    ROS: Unable to be obtained due to dementia  BP 100/50  Pulse 120 Constitutional: Elderly appearing female in a wheelchair in no acute distress HEENT: Normocephalic and atraumatic. Oropharynx is clear and moist. No oropharyngeal exudate. Conjunctivae are normal.  No scleral icterus. Upper dentures only Cardiovascular: Normal rate, regular rhythm and intact distal pulses. SEM Pulmonary/chest: Effort normal and breath sounds normal. No wheezing, rales or rhonchi. Abdominal: Soft, nontender, nondistended. Obese. Bowel sounds active throughout.  Extremities:  no clubbing, cyanosis, or edema Neurological: Awake, making eye contact not verbally oriented to person place or time Skin: Skin is warm and dry. No rashes noted. No jaundice Psychiatric: Normal mood and affect. Behavior is normal.  RELEVANT LABS AND IMAGING: CBC    Component Value Date/Time   WBC 12.0* 07/11/2012 1136   RBC 4.59 07/11/2012 1136   HGB 12.3 07/11/2012 1136   HCT 37.6 07/11/2012 1136   PLT 220.0 07/11/2012 1136   MCV 81.9 07/11/2012 1136   MCHC 32.6 07/11/2012 1136   RDW 14.6 07/11/2012 1136   LYMPHSABS 3.4 09/19/2008 0905   MONOABS 0.7 09/19/2008 0905   EOSABS 0.7 09/19/2008 0905   BASOSABS 0.1 09/19/2008 0905   HCV RNA by PCR qualitative -- negative Liver profile dated 06/18/2012 - total bili 0.6 alkaline phosphatase 125 AST 21 ALT 20 albumin 3.8 total protein 6.7 HIV 1 and 2 antibody nonreactive Hepatitis C antibody reactive Hepatitis B. surface antigen negative   ASSESSMENT/PLAN: 79 yo female with PMH of Alzheimer's, hypertension, diabetes, hyperlipidemia who is seen in consultation at the request of Dr. Chilton Si for evaluation of a positive hepatitis C antibody  1.  Hep C Ab +/RNA negative -- the patient's labs are reviewed in her hepatic function panel is normal. Her hepatitis C antibody is positive but her are and a qualitative is negative indicating previous infection but having cleared this infection entirely. This means she likely had hepatitis C previously but is now me and I would not be at risk to transmit this to others. Given her normal hepatic function panel, nothing further is felt necessary at this time. I will recheck a hepatitis C RNA quantitative for completeness. She can return as needed

## 2012-07-12 LAB — HEPATITIS C ANTIBODY: HCV Ab: NEGATIVE

## 2012-07-12 LAB — HEPATITIS B SURFACE ANTIGEN: Hepatitis B Surface Ag: NEGATIVE

## 2012-07-13 LAB — HEPATITIS C RNA QUANTITATIVE: HCV Quantitative: NOT DETECTED IU/mL (ref <15)

## 2012-07-23 ENCOUNTER — Telehealth: Payer: Self-pay | Admitting: Gastroenterology

## 2012-07-23 ENCOUNTER — Other Ambulatory Visit: Payer: Self-pay | Admitting: *Deleted

## 2012-07-23 MED ORDER — CLONAZEPAM 0.5 MG PO TABS: ORAL_TABLET | ORAL | Status: DC

## 2012-07-23 NOTE — Telephone Encounter (Signed)
Faxed office note to Dr. Chilton Si and Johny Blamer @ [facility]. [phone]

## 2012-08-27 ENCOUNTER — Encounter: Payer: Self-pay | Admitting: Adult Health

## 2012-08-27 ENCOUNTER — Non-Acute Institutional Stay (SKILLED_NURSING_FACILITY): Payer: Medicare Other | Admitting: Adult Health

## 2012-08-27 DIAGNOSIS — R768 Other specified abnormal immunological findings in serum: Secondary | ICD-10-CM

## 2012-08-27 DIAGNOSIS — F259 Schizoaffective disorder, unspecified: Secondary | ICD-10-CM | POA: Insufficient documentation

## 2012-08-27 DIAGNOSIS — F411 Generalized anxiety disorder: Secondary | ICD-10-CM

## 2012-08-27 DIAGNOSIS — R894 Abnormal immunological findings in specimens from other organs, systems and tissues: Secondary | ICD-10-CM

## 2012-08-27 DIAGNOSIS — G309 Alzheimer's disease, unspecified: Secondary | ICD-10-CM

## 2012-08-27 DIAGNOSIS — E7800 Pure hypercholesterolemia, unspecified: Secondary | ICD-10-CM

## 2012-08-27 DIAGNOSIS — I10 Essential (primary) hypertension: Secondary | ICD-10-CM

## 2012-08-27 DIAGNOSIS — F0280 Dementia in other diseases classified elsewhere without behavioral disturbance: Secondary | ICD-10-CM

## 2012-08-27 DIAGNOSIS — F419 Anxiety disorder, unspecified: Secondary | ICD-10-CM | POA: Insufficient documentation

## 2012-08-27 NOTE — Progress Notes (Signed)
Patient ID: Marisa Scott, female   DOB: 03/23/1929, 79 y.o.   MRN: [id number]       PROGRESS NOTE  DATE: 08/27/2012  FACILITY: Nursing Home Location: [facility]  LEVEL OF CARE: SNF (31)  Routine Visit  CHIEF COMPLAINT:  Manage Anxiety, Essential tremors,Dementia and Schizo-affective disorder  HISTORY OF PRESENT ILLNESS:  REASSESSMENT OF ONGOING PROBLEM(S): Alzheimer's Dementia: The dementia remaines stable and continues to function adequately in the current living environment with supervision.  The patient has had little changes in behavior. No complications noted from the medications presently being used.  SCHIZO-AFFECTIVE DISORDER : The schizophrenia remains stable. Patient denies hallucinations delusions or paranoia. No complications reported from the medications currently being used.  HTN: Pt 's HTN remains stable.  Denies CP, sob, DOE, pedal edema, headaches, dizziness or visual disturbances.  No complications from the medications currently being used.  Last BP : 137/72   PAST MEDICAL HISTORY : Reviewed.  No changes.  CURRENT MEDICATIONS: Reviewed per [facility]  REVIEW OF SYSTEMS:  GENERAL: no change in appetite, no fatigue, no weight changes, no fever, chills or weakness RESPIRATORY: no cough, SOB, DOE, wheezing, hemoptysis CARDIAC: no chest pain, edema or palpitations GI: no abdominal pain, diarrhea, constipation, heart burn, nausea or vomiting  PHYSICAL EXAMINATION  VS:  T 98.2      P 90      RR 18      BP 137/72         WT 152.6 (Lb)  GENERAL: no acute distress, normal body habitus EYES: conjunctivae normal, sclerae normal, normal eye lids NECK: supple, trachea midline, no neck masses, no thyroid tenderness, no thyromegaly LYMPHATICS: no LAN in the neck, no supraclavicular LAN RESPIRATORY: breathing is even & unlabored, BS CTAB CARDIAC: RRR, no murmur,no extra heart sounds, no edema GI: abdomen soft, normal BS, no masses, no tenderness, no  hepatomegaly, no splenomegaly PSYCHIATRIC: the patient is alert & oriented to person, affect & behavior appropriate  LABS/RADIOLOGY: 07/17/12 hemoglobin A1c 6.9 hepatitis C antibody negative 07/16/12 WBC 9.6 hemoglobin 11.0 hematocrit 33.9  06/22/12  HCV RNA quantitative negative 06/18/12 cholesterol 179 triglycerides 229 LDL 86 HDL 47 liver profile normal except alkaline phosphatase 125 2/14 MCV 76.5 otherwise CBC normal, HDL 50, LDL 179, total cholesterol 281, triglycerides 268, liver profile normal  ASSESSMENT/PLAN:  Anxiety - stable  Schizo-affective disorder - stable  Hepatitis C antibody test positive -  Hep C Ab +/RNA negative - not infectious   Alzheimer's disease - stable  Type II or unspecified type diabetes mellitus without mention of complication, uncontrolled - stable  Essential hypertension, benign - well-controlled  Pure hypercholesterolemia - stable     CPT CODE: 21308

## 2012-09-27 ENCOUNTER — Non-Acute Institutional Stay (SKILLED_NURSING_FACILITY): Payer: Medicare Other | Admitting: Internal Medicine

## 2012-09-27 DIAGNOSIS — E7800 Pure hypercholesterolemia, unspecified: Secondary | ICD-10-CM

## 2012-09-27 DIAGNOSIS — I10 Essential (primary) hypertension: Secondary | ICD-10-CM

## 2012-09-27 DIAGNOSIS — F0280 Dementia in other diseases classified elsewhere without behavioral disturbance: Secondary | ICD-10-CM

## 2012-09-27 NOTE — Progress Notes (Signed)
PROGRESS NOTE  DATE: 09-27-12  FACILITY: Nursing Home Location: [facility]  LEVEL OF CARE: SNF (31)  Routine Visit  CHIEF COMPLAINT:  Manage Alzheimer's dementia, diabetes mellitus and hypertension  HISTORY OF PRESENT ILLNESS:  REASSESSMENT OF ONGOING PROBLEM(S):  HTN: Pt 's HTN remains stable.  Denies CP, sob, DOE, pedal edema, headaches, dizziness or visual disturbances.  No complications from the medications currently being used.  Last BP : 130/80, 114/61, 126/79, 134/73.  DM:pt's DM remains stable.  Pt denies polyuria, polydipsia, polyphagia, changes in vision or hypoglycemic episodes.  No complications noted from the medication presently being used.  Last hemoglobin A1c is: 7.3 in 2/14, in 8-14 hemoglobin A1c 7.8.  DEMENTIA: The dementia remaines stable and continues to function adequately in the current living environment with supervision.  The patient has had little changes in behavior. No complications noted from the medications presently being used.  PAST MEDICAL HISTORY : Reviewed.  No changes.  CURRENT MEDICATIONS: Reviewed per [facility]  REVIEW OF SYSTEMS:  GENERAL: no change in appetite, no fatigue, no weight changes, no fever, chills or weakness RESPIRATORY: no cough, SOB, DOE, wheezing, hemoptysis CARDIAC: no chest pain, edema or palpitations GI: no abdominal pain, diarrhea, constipation, heart burn, nausea or vomiting  PHYSICAL EXAMINATION  VS:  T 96.9       P 87     RR 16      BP     POX % 96     WT (Lb)  GENERAL: no acute distress, obese body habitus EYES: conjunctivae normal, sclerae normal, normal eye lids NECK: supple, trachea midline, no neck masses, no thyroid tenderness, no thyromegaly LYMPHATICS: no LAN in the neck, no supraclavicular LAN RESPIRATORY: breathing is even & unlabored, BS CTAB CARDIAC: RRR, no murmur,no extra heart sounds, no edema GI: abdomen soft, normal BS, no masses, no tenderness, no hepatomegaly, no  splenomegaly PSYCHIATRIC: the patient is alert & oriented to person, affect & behavior appropriate  LABS/RADIOLOGY:  8-14 triglycerides 175 otherwise fasting lipid panel normal, CBC normal, total protein 5.8, albumin 3.1 otherwise liver profile normal  5-14 glucose 121 otherwise BMP normal  2/14 MCV 76.5 otherwise CBC normal, HDL 50, LDL 179, total cholesterol 281, triglycerides 268, liver profile normal  ASSESSMENT/PLAN:  Alzheimer's dementia-stable. diabetes mellitus-uncontrolled. Increase Lantus to 46 units every morning and 26 units each bedtime hypertension-well-controlled. Hyperlipidemia-well controlled iron deficiency anemia-hemoglobin is normal off iron hypokalemia -- well repleted  CPT CODE: 11914

## 2012-10-22 ENCOUNTER — Other Ambulatory Visit: Payer: Self-pay | Admitting: *Deleted

## 2012-10-22 MED ORDER — CLONAZEPAM 0.5 MG PO TABS: ORAL_TABLET | ORAL | Status: DC

## 2012-10-24 ENCOUNTER — Non-Acute Institutional Stay (SKILLED_NURSING_FACILITY): Payer: Medicare Other | Admitting: Internal Medicine

## 2012-10-24 DIAGNOSIS — I10 Essential (primary) hypertension: Secondary | ICD-10-CM

## 2012-10-24 DIAGNOSIS — E7800 Pure hypercholesterolemia, unspecified: Secondary | ICD-10-CM

## 2012-10-24 DIAGNOSIS — F0280 Dementia in other diseases classified elsewhere without behavioral disturbance: Secondary | ICD-10-CM

## 2012-10-24 NOTE — Progress Notes (Signed)
PROGRESS NOTE  DATE: 10-24-12  FACILITY: Nursing Home Location: [facility]  LEVEL OF CARE: SNF (31)  Routine Visit  CHIEF COMPLAINT:  Manage Alzheimer's dementia, diabetes mellitus and hypertension  HISTORY OF PRESENT ILLNESS:  REASSESSMENT OF ONGOING PROBLEM(S):  HTN: Pt 's HTN remains stable.  Staff Deny CP, sob, DOE, pedal edema, headaches, dizziness or visual disturbances.  No complications from the medications currently being used.  Last BP : 130/80, 114/61, 126/79, 134/73, 97/52.  DM:pt's DM remains stable.  Staff deny polyuria, polydipsia, polyphagia, changes in vision or hypoglycemic episodes.  No complications noted from the medication presently being used.  Last hemoglobin A1c is: 7.3 in 2/14, in 8-14 hemoglobin A1c 7.8.  DEMENTIA: The dementia remaines stable and continues to function adequately in the current living environment with supervision.  The patient has had little changes in behavior. No complications noted from the medications presently being used. Patient is a poor historian.  PAST MEDICAL HISTORY : Reviewed.  No changes.  CURRENT MEDICATIONS: Reviewed per [facility]  REVIEW OF SYSTEMS: Unobtainable due to dementia  PHYSICAL EXAMINATION  VS:  T 97.7       P 96    RR 20      BP     POX % 96     WT (Lb)  GENERAL: no acute distress, obese body habitus NECK: supple, trachea midline, no neck masses, no thyroid tenderness, no thyromegaly RESPIRATORY: breathing is even & unlabored, BS CTAB CARDIAC: RRR, no murmur,no extra heart sounds, no edema GI: abdomen soft, normal BS, no masses, no tenderness, no hepatomegaly, no splenomegaly PSYCHIATRIC: the patient is alert & oriented to person, affect & behavior appropriate  LABS/RADIOLOGY:  8-14 triglycerides 175 otherwise fasting lipid panel normal, CBC normal, total protein 5.8, albumin 3.1 otherwise liver profile normal  5-14 glucose 121 otherwise BMP normal  2/14 MCV 76.5 otherwise CBC normal,  HDL 50, LDL 179, total cholesterol 281, triglycerides 268, liver profile normal  ASSESSMENT/PLAN:  Alzheimer's dementia-stable. diabetes mellitus-uncontrolled. Lantus was increased. hypertension-well-controlled. Hyperlipidemia-well controlled hypokalemia -- well repleted  CPT CODE: 78295

## 2012-11-28 ENCOUNTER — Non-Acute Institutional Stay (SKILLED_NURSING_FACILITY): Payer: PRIVATE HEALTH INSURANCE | Admitting: Internal Medicine

## 2012-11-28 DIAGNOSIS — I10 Essential (primary) hypertension: Secondary | ICD-10-CM

## 2012-11-28 DIAGNOSIS — E7800 Pure hypercholesterolemia, unspecified: Secondary | ICD-10-CM

## 2012-11-28 DIAGNOSIS — F0280 Dementia in other diseases classified elsewhere without behavioral disturbance: Secondary | ICD-10-CM

## 2012-11-28 NOTE — Progress Notes (Signed)
PROGRESS NOTE  DATE: 11-28-12  FACILITY: Nursing Home Location: [facility]  LEVEL OF CARE: SNF (31)  Routine Visit  CHIEF COMPLAINT:  Manage Alzheimer's dementia, diabetes mellitus and hypertension  HISTORY OF PRESENT ILLNESS:  REASSESSMENT OF ONGOING PROBLEM(S):  HTN: Pt 's HTN remains stable.  Staff Deny CP, sob, DOE, pedal edema, headaches, dizziness or visual disturbances.  No complications from the medications currently being used.  Last BP : 130/80, 114/61, 126/79, 134/73, 97/52, 128/70.  DM:pt's DM remains stable.  Staff deny polyuria, polydipsia, polyphagia, changes in vision or hypoglycemic episodes.  No complications noted from the medication presently being used.  Last hemoglobin A1c is: 7.3 in 2/14, in 8-14 hemoglobin A1c 7.8.  DEMENTIA: The dementia remaines stable and continues to function adequately in the current living environment with supervision.  The patient has had little changes in behavior. No complications noted from the medications presently being used. Patient is a poor historian.  PAST MEDICAL HISTORY : Reviewed.  No changes.  CURRENT MEDICATIONS: Reviewed per [facility]  REVIEW OF SYSTEMS: Unobtainable due to dementia  PHYSICAL EXAMINATION  VS:  T 98.2       P 90    RR 18      BP     POX % 96     WT (Lb)  GENERAL: no acute distress, obese body habitus EYES: nl sclerae, nl conjunctivae, no discharge NECK: supple, trachea midline, no neck masses, no thyroid tenderness, no thyromegaly LYMPHATICS: No cervical lymphadenopathy, no supraclavicular lymphadenopathy RESPIRATORY: breathing is even & unlabored, BS CTAB CARDIAC: RRR, no murmur,no extra heart sounds, no edema GI: abdomen soft, normal BS, no masses, no tenderness, no hepatomegaly, no splenomegaly PSYCHIATRIC: the patient is alert & oriented to person, affect & behavior appropriate  LABS/RADIOLOGY:  8-14 triglycerides 175 otherwise fasting lipid panel normal, CBC normal, total  protein 5.8, albumin 3.1 otherwise liver profile normal  5-14 glucose 121 otherwise BMP normal  2/14 MCV 76.5 otherwise CBC normal, HDL 50, LDL 179, total cholesterol 281, triglycerides 268, liver profile normal  ASSESSMENT/PLAN:  Alzheimer's dementia-stable. diabetes mellitus-uncontrolled. Lantus was increased. hypertension-well-controlled. Hyperlipidemia-well controlled hypokalemia -- well repleted  CPT CODE: 47829

## 2012-12-09 IMAGING — CR DG ANKLE COMPLETE 3+V*L*
3 series · 3 of 3 positions shown · non-contrast
Comparison: None.

CLINICAL DATA: Left ankle injury.

LEFT ANKLE COMPLETE - 3+ VIEW

[x ankle ap left]
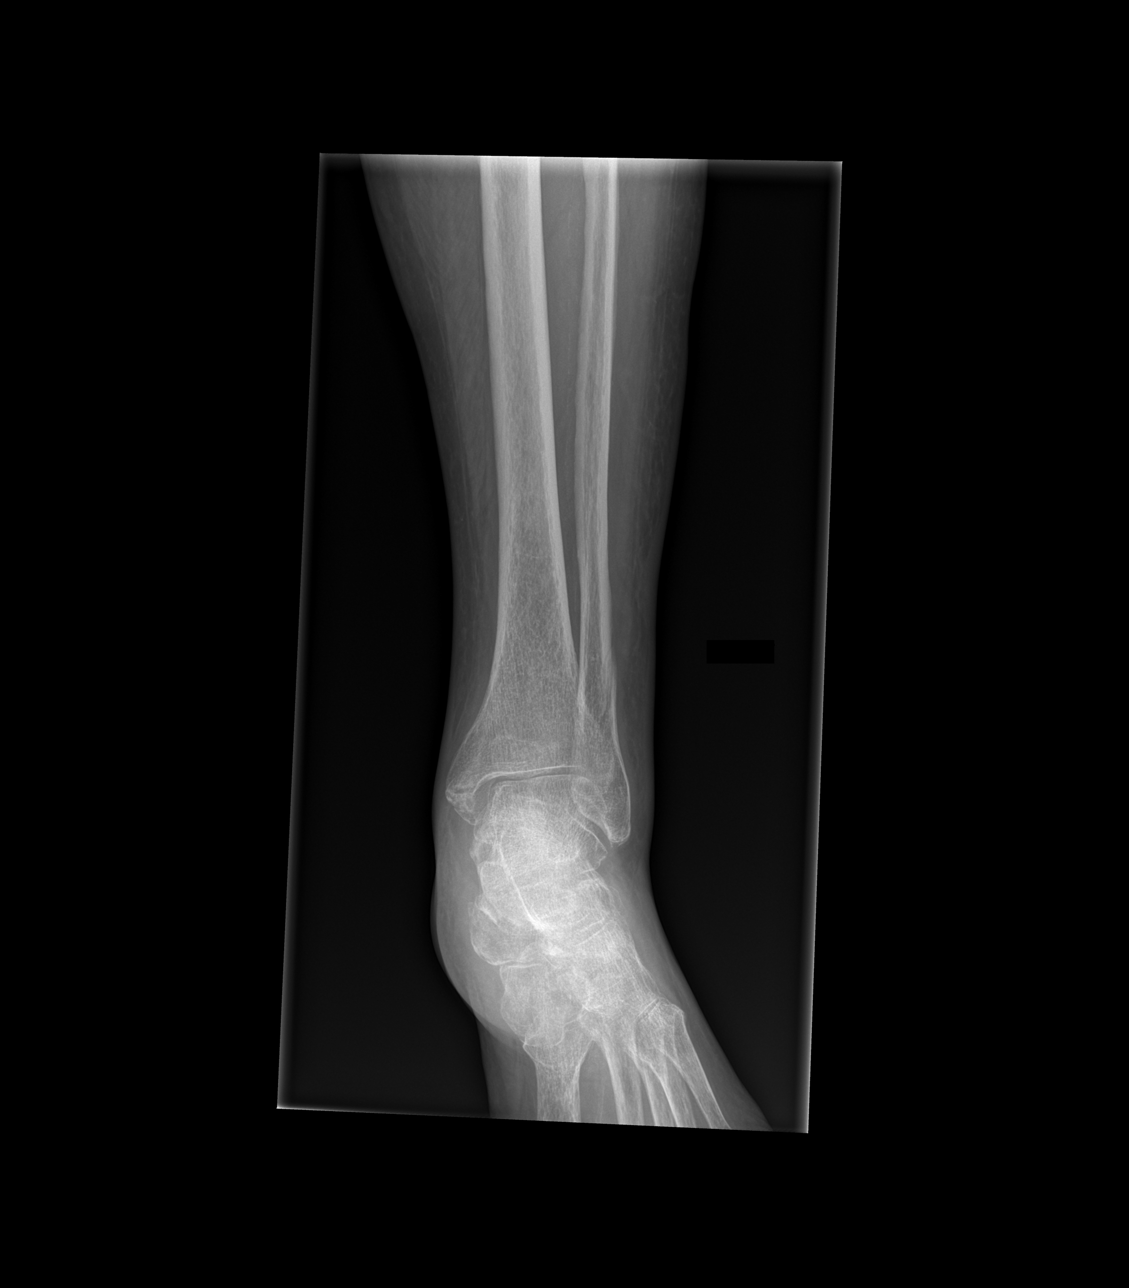

[x ankle obl left]
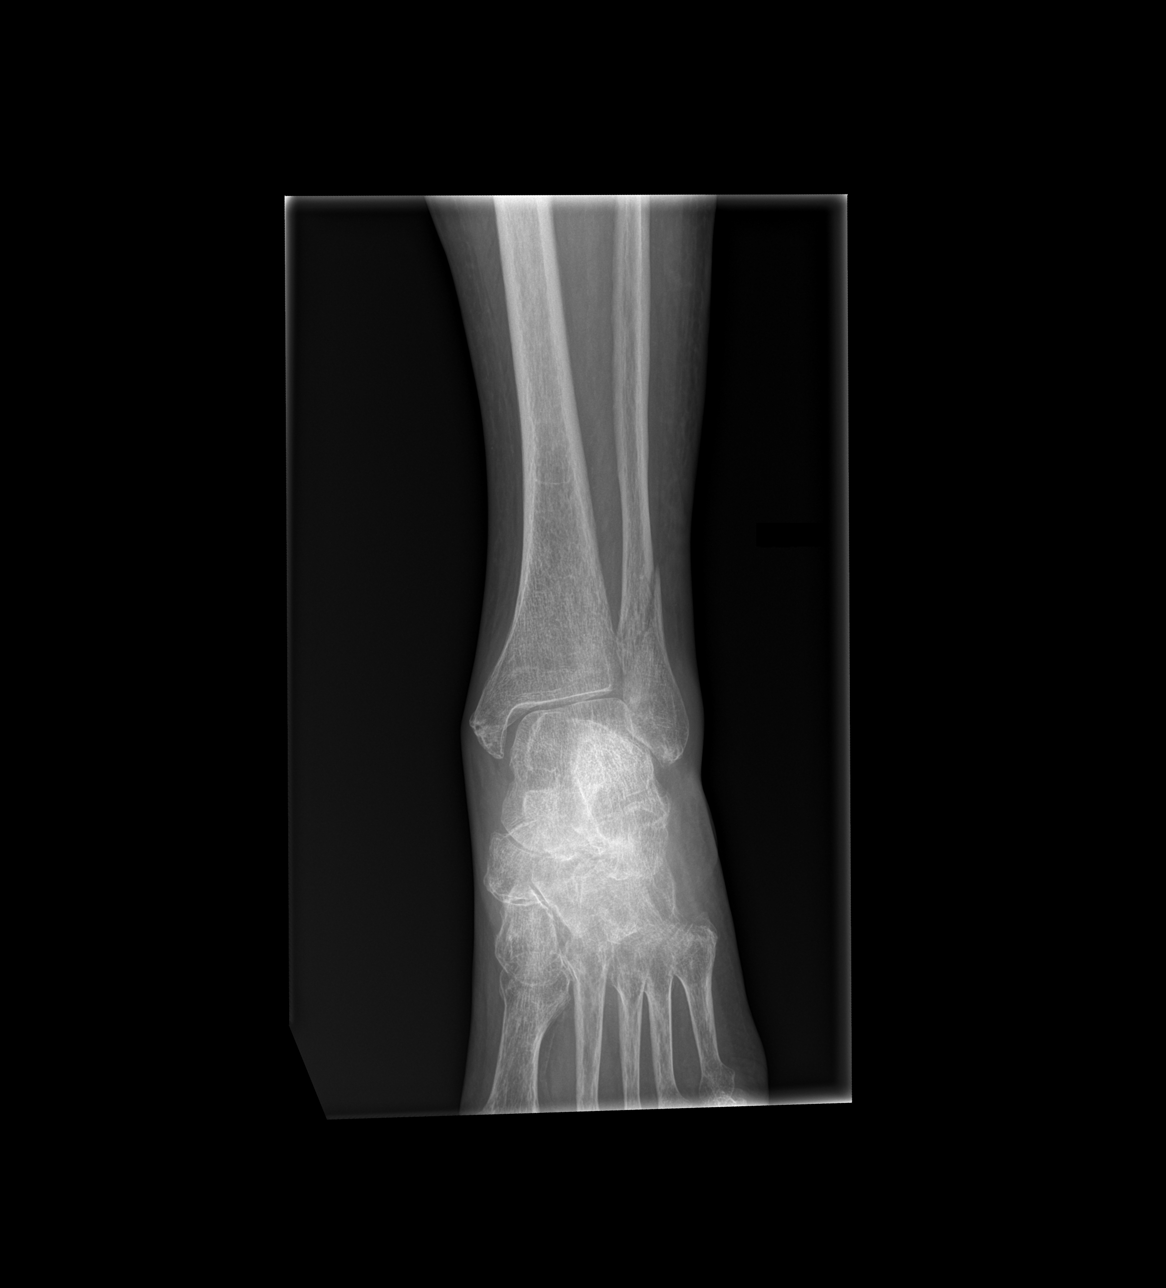

[x ankle lat left]
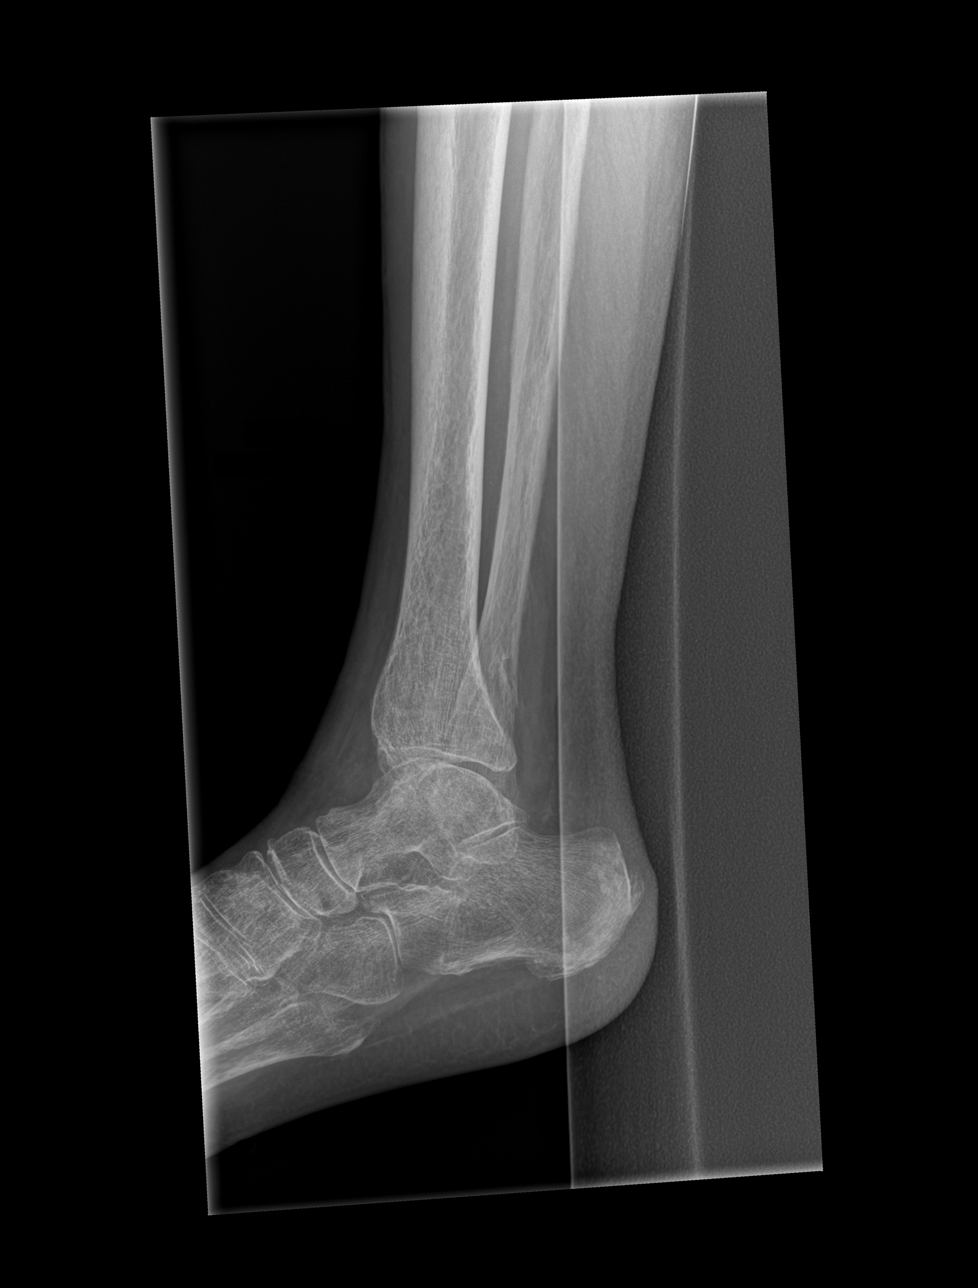

[3 of 3 positions shown; findings below may reference images not displayed]

FINDINGS: Minimally displaced oblique fracture through the distal
fibula present with overlying soft tissue swelling.  There is
irregularity of the medial malleolus without definite acute
fracture.  This may relate to prior injury.
IMPRESSION: Minimally displaced fracture of the distal fibula.  Irregularity of
the medial malleolus is felt to most likely relate to old injury.

## 2012-12-19 ENCOUNTER — Encounter: Payer: Self-pay | Admitting: Internal Medicine

## 2012-12-19 ENCOUNTER — Non-Acute Institutional Stay (SKILLED_NURSING_FACILITY): Payer: PRIVATE HEALTH INSURANCE | Admitting: Internal Medicine

## 2012-12-19 DIAGNOSIS — E7800 Pure hypercholesterolemia, unspecified: Secondary | ICD-10-CM

## 2012-12-19 DIAGNOSIS — I10 Essential (primary) hypertension: Secondary | ICD-10-CM

## 2012-12-19 DIAGNOSIS — F0280 Dementia in other diseases classified elsewhere without behavioral disturbance: Secondary | ICD-10-CM

## 2012-12-19 NOTE — Progress Notes (Signed)
PROGRESS NOTE  DATE: 12-19-12  FACILITY: Nursing Home Location: [facility]  LEVEL OF CARE: SNF (31)  Routine Visit  CHIEF COMPLAINT:  Manage Alzheimer's dementia, diabetes mellitus and hypertension  HISTORY OF PRESENT ILLNESS:  REASSESSMENT OF ONGOING PROBLEM(S):  HTN: Pt 's HTN remains stable.  Staff Deny CP, sob, DOE, pedal edema, headaches, dizziness or visual disturbances.  No complications from the medications currently being used.  Last BP : 130/80, 114/61, 126/79, 134/73, 97/52, 128/70, 128/86.  DM:pt's DM remains stable.  Staff deny polyuria, polydipsia, polyphagia, changes in vision or hypoglycemic episodes.  No complications noted from the medication presently being used.  Last hemoglobin A1c is: 7.3 in 2/14, in 8-14 hemoglobin A1c 7.8.  DEMENTIA: The dementia remaines stable and continues to function adequately in the current living environment with supervision.  The patient has had little changes in behavior. No complications noted from the medications presently being used. Patient is a poor historian.  PAST MEDICAL HISTORY : Reviewed.  No changes.  CURRENT MEDICATIONS: Reviewed per [facility]  REVIEW OF SYSTEMS: Unobtainable due to dementia  PHYSICAL EXAMINATION  VS:  T 97.5       P 70    RR 18      BP 128/86    POX % 97     WT (Lb)  GENERAL: no acute distress, obese body habitus EYES: nl sclerae, nl conjunctivae, no discharge NECK: supple, trachea midline, no neck masses, no thyroid tenderness, no thyromegaly LYMPHATICS: No cervical lymphadenopathy, no supraclavicular lymphadenopathy RESPIRATORY: breathing is even & unlabored, BS CTAB CARDIAC: RRR, no murmur,no extra heart sounds, no edema GI: abdomen soft, normal BS, no masses, no tenderness, no hepatomegaly, no splenomegaly PSYCHIATRIC: the patient is alert & oriented to person, affect & behavior appropriate  LABS/RADIOLOGY:  8-14 triglycerides 175 otherwise fasting lipid panel normal, CBC  normal, total protein 5.8, albumin 3.1 otherwise liver profile normal  5-14 glucose 121 otherwise BMP normal  2/14 MCV 76.5 otherwise CBC normal, HDL 50, LDL 179, total cholesterol 281, triglycerides 268, liver profile normal  ASSESSMENT/PLAN:  Alzheimer's dementia-stable. Risperdal was decreased diabetes mellitus-uncontrolled. Lantus was increased. hypertension-well-controlled. Hyperlipidemia-well controlled hypokalemia -- well repleted Check BMP  CPT CODE: 16109

## 2013-01-18 ENCOUNTER — Non-Acute Institutional Stay (SKILLED_NURSING_FACILITY): Payer: PRIVATE HEALTH INSURANCE | Admitting: Internal Medicine

## 2013-01-18 ENCOUNTER — Encounter: Payer: Self-pay | Admitting: Internal Medicine

## 2013-01-18 DIAGNOSIS — E7800 Pure hypercholesterolemia, unspecified: Secondary | ICD-10-CM

## 2013-01-18 DIAGNOSIS — F0280 Dementia in other diseases classified elsewhere without behavioral disturbance: Secondary | ICD-10-CM

## 2013-01-18 DIAGNOSIS — I10 Essential (primary) hypertension: Secondary | ICD-10-CM

## 2013-01-18 NOTE — Progress Notes (Signed)
PROGRESS NOTE  DATE: 01-18-13  FACILITY: Nursing Home Location: [facility]  LEVEL OF CARE: SNF (31)  Routine Visit  CHIEF COMPLAINT:  Manage Alzheimer's dementia, diabetes mellitus and hypertension  HISTORY OF PRESENT ILLNESS:  REASSESSMENT OF ONGOING PROBLEM(S):  HTN: Pt 's HTN remains stable.  Staff Deny CP, sob, DOE, pedal edema, headaches, dizziness or visual disturbances.  No complications from the medications currently being used.  Last BP : 130/80, 114/61, 126/79, 134/73, 97/52, 128/70, 128/86, 126/63.  DM:pt's DM remains stable.  Staff deny polyuria, polydipsia, polyphagia, changes in vision or hypoglycemic episodes.  No complications noted from the medication presently being used.  Last hemoglobin A1c is: 7.3 in 2/14, in 8-14 hemoglobin A1c 7.8, in 11-14 hemoglobin A1c 8.1  DEMENTIA: The dementia remaines stable and continues to function adequately in the current living environment with supervision.  The patient has had little changes in behavior. No complications noted from the medications presently being used. Patient is a poor historian.  PAST MEDICAL HISTORY : Reviewed.  No changes.  CURRENT MEDICATIONS: Reviewed per [facility]  REVIEW OF SYSTEMS: Unobtainable due to dementia  PHYSICAL EXAMINATION  VS:  T 97.2       P 84    RR 16      BP 126/63    POX % 96      GENERAL: no acute distress, obese body habitus EYES: nl sclerae, nl conjunctivae, no discharge NECK: supple, trachea midline, no neck masses, no thyroid tenderness, no thyromegaly LYMPHATICS: No cervical lymphadenopathy, no supraclavicular lymphadenopathy RESPIRATORY: breathing is even & unlabored, BS CTAB CARDIAC: RRR, no murmur,no extra heart sounds, no edema GI: abdomen soft, normal BS, no masses, no tenderness, no hepatomegaly, no splenomegaly PSYCHIATRIC: the patient is alert & oriented to person, affect & behavior appropriate  LABS/RADIOLOGY: 11-14 glucose 228 otherwise BMP normal,  triglycerides 200 otherwise fasting lipid panel normal, CBC normal, albumin 3.3 otherwise liver profile normal  8-14 triglycerides 175 otherwise fasting lipid panel normal, CBC normal, total protein 5.8, albumin 3.1 otherwise liver profile normal  5-14 glucose 121 otherwise BMP normal  2/14 MCV 76.5 otherwise CBC normal, HDL 50, LDL 179, total cholesterol 281, triglycerides 268, liver profile normal  ASSESSMENT/PLAN:  Alzheimer's dementia-stable.  diabetes mellitus-uncontrolled. Increase Lantus to 49 units every morning and 26 units each bedtime hypertension-well-controlled. Hyperlipidemia-well controlled hypokalemia -- well repleted  CPT CODE: 57846

## 2013-02-15 ENCOUNTER — Non-Acute Institutional Stay (SKILLED_NURSING_FACILITY): Payer: PRIVATE HEALTH INSURANCE | Admitting: Internal Medicine

## 2013-02-15 DIAGNOSIS — I10 Essential (primary) hypertension: Secondary | ICD-10-CM

## 2013-02-15 DIAGNOSIS — E7800 Pure hypercholesterolemia, unspecified: Secondary | ICD-10-CM

## 2013-02-15 DIAGNOSIS — E1165 Type 2 diabetes mellitus with hyperglycemia: Principal | ICD-10-CM

## 2013-02-15 DIAGNOSIS — G309 Alzheimer's disease, unspecified: Secondary | ICD-10-CM

## 2013-02-15 DIAGNOSIS — F0280 Dementia in other diseases classified elsewhere without behavioral disturbance: Secondary | ICD-10-CM

## 2013-02-15 DIAGNOSIS — IMO0001 Reserved for inherently not codable concepts without codable children: Secondary | ICD-10-CM

## 2013-02-15 NOTE — Progress Notes (Signed)
         PROGRESS NOTE  DATE: 02-15-13  FACILITY: Nursing Home Location: [facility]  LEVEL OF CARE: SNF (31)  Routine Visit  CHIEF COMPLAINT:  Manage Alzheimer's dementia, diabetes mellitus and hypertension  HISTORY OF PRESENT ILLNESS:  REASSESSMENT OF ONGOING PROBLEM(S):  HTN: Pt 's HTN remains stable.  Staff Deny CP, sob, DOE, pedal edema, headaches, dizziness or visual disturbances.  No complications from the medications currently being used.  Last BP : 130/80, 114/61, 126/79, 134/73, 97/52, 128/70, 128/86, 126/63, 136/80.  DM:pt's DM remains stable.  Staff deny polyuria, polydipsia, polyphagia, changes in vision or hypoglycemic episodes.  No complications noted from the medication presently being used.  Last hemoglobin A1c is: 7.3 in 2/14, in 8-14 hemoglobin A1c 7.8, in 11-14 hemoglobin A1c 8.1  DEMENTIA: The dementia remaines stable and continues to function adequately in the current living environment with supervision.  The patient has had little changes in behavior. No complications noted from the medications presently being used. Patient is a poor historian.  PAST MEDICAL HISTORY : Reviewed.  No changes.  [facility]  REVIEW OF SYSTEMS: Unobtainable due to dementia  PHYSICAL EXAMINATION  VS:  T 99.1      P 60    RR 18     BP 136/80    POX % 98      GENERAL: no acute distress, obese body habitus EYES: nl sclerae, nl conjunctivae, no discharge NECK: supple, trachea midline, no neck masses, no thyroid tenderness, no thyromegaly LYMPHATICS: No cervical lymphadenopathy, no supraclavicular lymphadenopathy RESPIRATORY: breathing is even & unlabored, BS CTAB CARDIAC: RRR, no murmur,no extra heart sounds, no edema GI: abdomen soft, normal BS, no masses, no tenderness, no hepatomegaly, no splenomegaly PSYCHIATRIC: the patient is alert & oriented to person, affect & behavior appropriate  LABS/RADIOLOGY:  12-14 iron panel normal,  ferritin 63, vitamin B12 level 562, RBC folate 952 11-14 glucose 228 otherwise BMP normal, triglycerides 200 otherwise fasting lipid panel normal, CBC normal, albumin 3.3 otherwise liver profile normal  8-14 triglycerides 175 otherwise fasting lipid panel normal, CBC normal, total protein 5.8, albumin 3.1 otherwise liver profile normal  5-14 glucose 121 otherwise BMP normal  2/14 MCV 76.5 otherwise CBC normal, HDL 50, LDL 179, total cholesterol 281, triglycerides 268, liver profile normal  ASSESSMENT/PLAN:  Alzheimer's dementia-stable.  diabetes mellitus-uncontrolled. Lantus was increased. hypertension-well-controlled. Hyperlipidemia-well controlled hypokalemia -- well repleted  CPT CODE: [id number]

## 2013-05-06 ENCOUNTER — Other Ambulatory Visit: Payer: Self-pay | Admitting: *Deleted

## 2013-05-06 MED ORDER — CLONAZEPAM 0.5 MG PO TABS: ORAL_TABLET | ORAL | Status: DC

## 2013-05-06 NOTE — Telephone Encounter (Signed)
[facility] 

## 2013-05-21 ENCOUNTER — Non-Acute Institutional Stay (SKILLED_NURSING_FACILITY): Payer: PRIVATE HEALTH INSURANCE | Admitting: Internal Medicine

## 2013-05-21 DIAGNOSIS — G309 Alzheimer's disease, unspecified: Principal | ICD-10-CM

## 2013-05-21 DIAGNOSIS — IMO0001 Reserved for inherently not codable concepts without codable children: Secondary | ICD-10-CM

## 2013-05-21 DIAGNOSIS — E1165 Type 2 diabetes mellitus with hyperglycemia: Secondary | ICD-10-CM

## 2013-05-21 DIAGNOSIS — E7800 Pure hypercholesterolemia, unspecified: Secondary | ICD-10-CM

## 2013-05-21 DIAGNOSIS — I10 Essential (primary) hypertension: Secondary | ICD-10-CM

## 2013-05-21 DIAGNOSIS — F0280 Dementia in other diseases classified elsewhere without behavioral disturbance: Secondary | ICD-10-CM

## 2013-05-22 NOTE — Progress Notes (Signed)
         PROGRESS NOTE  DATE: 05-21-13  FACILITY: Nursing Home Location: [facility]  LEVEL OF CARE: SNF (31)  Routine Visit  CHIEF COMPLAINT:  Manage Alzheimer's dementia, diabetes mellitus and hypertension  HISTORY OF PRESENT ILLNESS:  REASSESSMENT OF ONGOING PROBLEM(S):  HTN: Pt 's HTN remains stable.  Staff Deny CP, sob, DOE, pedal edema, headaches, dizziness or visual disturbances.  No complications from the medications currently being used.  Last BP : 130/80, 114/61, 126/79, 134/73, 97/52, 128/70, 128/86, 126/63, 136/80, 122/60.  DM:pt's DM remains stable.  Staff deny polyuria, polydipsia, polyphagia, changes in vision or hypoglycemic episodes.  No complications noted from the medication presently being used.  Last hemoglobin A1c is: 7.3 in 2/14, in 8-14 hemoglobin A1c 7.8, in 11-14 hemoglobin A1c 8.1, in 2-15 hemoglobin A1c 10.6  DEMENTIA: The dementia remaines stable and continues to function adequately in the current living environment with supervision.  The patient has had little changes in behavior. No complications noted from the medications presently being used. Patient is a poor historian.  PAST MEDICAL HISTORY : Reviewed.  No changes.  [facility]  REVIEW OF SYSTEMS: Unobtainable due to dementia  PHYSICAL EXAMINATION  VS:  See VS section     GENERAL: no acute distress, obese body habitus EYES: nl sclerae, nl conjunctivae, no discharge NECK: supple, trachea midline, no neck masses, no thyroid tenderness, no thyromegaly LYMPHATICS: No cervical lymphadenopathy, no supraclavicular lymphadenopathy RESPIRATORY: breathing is even & unlabored, BS CTAB CARDIAC: RRR, no murmur,no extra heart sounds, no edema GI: abdomen soft, normal BS, no masses, no tenderness, no hepatomegaly, no splenomegaly PSYCHIATRIC: the patient is alert & oriented to person, affect & behavior appropriate  LABS/RADIOLOGY: 2-15 hemoglobin 11.2, MCV 79.3  otherwise CBC normal  12-14 iron panel normal, ferritin 63, vitamin B12 level 562, RBC folate 952 11-14 glucose 228 otherwise BMP normal, triglycerides 200 otherwise fasting lipid panel normal, CBC normal, albumin 3.3 otherwise liver profile normal  8-14 triglycerides 175 otherwise fasting lipid panel normal, CBC normal, total protein 5.8, albumin 3.1 otherwise liver profile normal  5-14 glucose 121 otherwise BMP normal  2/14 MCV 76.5 otherwise CBC normal, HDL 50, LDL 179, total cholesterol 281, triglycerides 268, liver profile normal  ASSESSMENT/PLAN:  Alzheimer's dementia-stable. Risperdal was decreased diabetes mellitus-uncontrolled. Lantus was increased. hypertension-well-controlled. Hyperlipidemia-well controlled hypokalemia -- well repleted  CPT CODE: 1610999309

## 2013-06-13 ENCOUNTER — Non-Acute Institutional Stay (SKILLED_NURSING_FACILITY): Payer: PRIVATE HEALTH INSURANCE | Admitting: Internal Medicine

## 2013-06-13 DIAGNOSIS — I10 Essential (primary) hypertension: Secondary | ICD-10-CM

## 2013-06-13 DIAGNOSIS — E1165 Type 2 diabetes mellitus with hyperglycemia: Secondary | ICD-10-CM

## 2013-06-13 DIAGNOSIS — F0280 Dementia in other diseases classified elsewhere without behavioral disturbance: Secondary | ICD-10-CM

## 2013-06-13 DIAGNOSIS — E7800 Pure hypercholesterolemia, unspecified: Secondary | ICD-10-CM

## 2013-06-13 DIAGNOSIS — IMO0001 Reserved for inherently not codable concepts without codable children: Secondary | ICD-10-CM

## 2013-06-13 DIAGNOSIS — G309 Alzheimer's disease, unspecified: Principal | ICD-10-CM

## 2013-06-13 NOTE — Progress Notes (Signed)
          PROGRESS NOTE  DATE: 06-13-13  FACILITY: Nursing Home Location: [facility]  LEVEL OF CARE: SNF (31)  Routine Visit  CHIEF COMPLAINT:  Manage Alzheimer's dementia, diabetes mellitus and hypertension  HISTORY OF PRESENT ILLNESS:  REASSESSMENT OF ONGOING PROBLEM(S):  HTN: Pt 's HTN remains stable.  Staff Deny CP, sob, DOE, pedal edema, headaches, dizziness or visual disturbances.  No complications from the medications currently being used.  Last BP : 130/80, 114/61, 126/79, 134/73, 97/52, 128/70, 128/86, 126/63, 136/80, 122/60.  DM:pt's DM remains stable.  Staff deny polyuria, polydipsia, polyphagia, changes in vision or hypoglycemic episodes.  No complications noted from the medication presently being used.  Last hemoglobin A1c is: 7.3 in 2/14, in 8-14 hemoglobin A1c 7.8, in 11-14 hemoglobin A1c 8.1, in 2-15 hemoglobin A1c 10.6, 5-15 hemoglobin A1c 8.8  DEMENTIA: The dementia remaines stable and continues to function adequately in the current living environment with supervision.  The patient has had little changes in behavior. No complications noted from the medications presently being used. Patient is a poor historian.  PAST MEDICAL HISTORY : Reviewed.  No changes.  [facility]  REVIEW OF SYSTEMS: Unobtainable due to dementia  PHYSICAL EXAMINATION  VS:  See VS section     GENERAL: no acute distress, obese body habitus EYES: nl sclerae, nl conjunctivae, no discharge NECK: supple, trachea midline, no neck masses, no thyroid tenderness, no thyromegaly LYMPHATICS: No cervical lymphadenopathy, no supraclavicular lymphadenopathy RESPIRATORY: breathing is even & unlabored, BS CTAB CARDIAC: RRR, no murmur,no extra heart sounds, no edema GI: abdomen soft, normal BS, no masses, no tenderness, no hepatomegaly, no splenomegaly PSYCHIATRIC: the patient is alert & oriented to person, affect & behavior appropriate  LABS/RADIOLOGY: 4-15  fasting lipid panel normal, BMP normal, hemoglobin 10.2, MCV 82 otherwise CBC normal 2-15 hemoglobin 11.2, MCV 79.3 otherwise CBC normal  12-14 iron panel normal, ferritin 63, vitamin B12 level 562, RBC folate 952 11-14 glucose 228 otherwise BMP normal, triglycerides 200 otherwise fasting lipid panel normal, CBC normal, albumin 3.3 otherwise liver profile normal  8-14 triglycerides 175 otherwise fasting lipid panel normal, CBC normal, total protein 5.8, albumin 3.1 otherwise liver profile normal  5-14 glucose 121 otherwise BMP normal  2/14 MCV 76.5 otherwise CBC normal, HDL 50, LDL 179, total cholesterol 281, triglycerides 268, liver profile normal  ASSESSMENT/PLAN:  Alzheimer's dementia-advanced diabetes mellitus-uncontrolled. Lantus was increased. hypertension-well-controlled. Hyperlipidemia-well controlled hypokalemia -- well repleted  CPT CODE: 1610999309  Newton PiggGayani Y. Kerry Doryasanayaka, MD [facility] [phone]

## 2013-06-19 LAB — HM DIABETES EYE EXAM

## 2013-06-25 ENCOUNTER — Encounter: Payer: Self-pay | Admitting: Internal Medicine

## 2013-07-04 ENCOUNTER — Non-Acute Institutional Stay (SKILLED_NURSING_FACILITY): Payer: PRIVATE HEALTH INSURANCE | Admitting: Internal Medicine

## 2013-07-04 DIAGNOSIS — I10 Essential (primary) hypertension: Secondary | ICD-10-CM

## 2013-07-04 DIAGNOSIS — E7800 Pure hypercholesterolemia, unspecified: Secondary | ICD-10-CM

## 2013-07-04 DIAGNOSIS — G309 Alzheimer's disease, unspecified: Principal | ICD-10-CM

## 2013-07-04 DIAGNOSIS — F0280 Dementia in other diseases classified elsewhere without behavioral disturbance: Secondary | ICD-10-CM

## 2013-07-04 DIAGNOSIS — E1165 Type 2 diabetes mellitus with hyperglycemia: Secondary | ICD-10-CM

## 2013-07-04 DIAGNOSIS — IMO0001 Reserved for inherently not codable concepts without codable children: Secondary | ICD-10-CM

## 2013-07-05 NOTE — Progress Notes (Signed)
           PROGRESS NOTE  DATE: 07-04-13  FACILITY: Nursing Home Location: [facility]  LEVEL OF CARE: SNF (31)  Routine Visit  CHIEF COMPLAINT:  Manage Alzheimer's dementia, diabetes mellitus and hypertension  HISTORY OF PRESENT ILLNESS:  REASSESSMENT OF ONGOING PROBLEM(S):  HTN: Pt 's HTN remains stable.  Staff Deny CP, sob, DOE, pedal edema, headaches, dizziness or visual disturbances.  No complications from the medications currently being used.  Last BP : 130/80, 114/61, 126/79, 134/73, 97/52, 128/70, 128/86, 126/63, 136/80, 122/60, 128/75.  DM:pt's DM remains stable.  Staff deny polyuria, polydipsia, polyphagia, changes in vision or hypoglycemic episodes.  No complications noted from the medication presently being used.  Last hemoglobin A1c is: 7.3 in 2/14, in 8-14 hemoglobin A1c 7.8, in 11-14 hemoglobin A1c 8.1, in 2-15 hemoglobin A1c 10.6, 5-15 hemoglobin A1c 8.8  DEMENTIA: The dementia remaines stable and continues to function adequately in the current living environment with supervision.  The patient has had little changes in behavior. No complications noted from the medications presently being used. Patient is a poor historian.  PAST MEDICAL HISTORY : Reviewed.  No changes.  CURRENT MEDICATIONS: Reviewed per [facility]  REVIEW OF SYSTEMS: Unobtainable due to dementia  PHYSICAL EXAMINATION  VS:  See VS section     GENERAL: no acute distress, obese body habitus EYES: nl sclerae, nl conjunctivae, no discharge NECK: supple, trachea midline, no neck masses, no thyroid tenderness, no thyromegaly LYMPHATICS: No cervical lymphadenopathy, no supraclavicular lymphadenopathy RESPIRATORY: breathing is even & unlabored, BS CTAB CARDIAC: RRR, no murmur,no extra heart sounds, no edema GI: abdomen soft, normal BS, no masses, no tenderness, no hepatomegaly, no splenomegaly PSYCHIATRIC: the patient is alert & oriented to person, affect & behavior  appropriate  LABS/RADIOLOGY: 4-15 fasting lipid panel normal, BMP normal, hemoglobin 10.2, MCV 82 otherwise CBC normal 2-15 hemoglobin 11.2, MCV 79.3 otherwise CBC normal  12-14 iron panel normal, ferritin 63, vitamin B12 level 562, RBC folate 952 11-14 glucose 228 otherwise BMP normal, triglycerides 200 otherwise fasting lipid panel normal, CBC normal, albumin 3.3 otherwise liver profile normal  8-14 triglycerides 175 otherwise fasting lipid panel normal, CBC normal, total protein 5.8, albumin 3.1 otherwise liver profile normal  5-14 glucose 121 otherwise BMP normal  2/14 MCV 76.5 otherwise CBC normal, HDL 50, LDL 179, total cholesterol 281, triglycerides 268, liver profile normal  ASSESSMENT/PLAN:  Alzheimer's dementia-advanced diabetes mellitus-uncontrolled. Lantus was increased. hypertension-well-controlled. Hyperlipidemia-well controlled hypokalemia -- well repleted Check liver profile  CPT CODE: 02637  Newton Pigg. Kerry Dory, MD [facility] [phone]

## 2013-08-21 ENCOUNTER — Non-Acute Institutional Stay (SKILLED_NURSING_FACILITY): Payer: PRIVATE HEALTH INSURANCE | Admitting: Internal Medicine

## 2013-08-21 DIAGNOSIS — E1165 Type 2 diabetes mellitus with hyperglycemia: Secondary | ICD-10-CM

## 2013-08-21 DIAGNOSIS — IMO0001 Reserved for inherently not codable concepts without codable children: Secondary | ICD-10-CM

## 2013-08-21 DIAGNOSIS — E7800 Pure hypercholesterolemia, unspecified: Secondary | ICD-10-CM

## 2013-08-21 DIAGNOSIS — G309 Alzheimer's disease, unspecified: Principal | ICD-10-CM

## 2013-08-21 DIAGNOSIS — F0280 Dementia in other diseases classified elsewhere without behavioral disturbance: Secondary | ICD-10-CM

## 2013-08-21 DIAGNOSIS — I10 Essential (primary) hypertension: Secondary | ICD-10-CM

## 2013-08-21 NOTE — Progress Notes (Signed)
         PROGRESS NOTE  DATE: 08-21-13  FACILITY: Nursing Home Location: [facility]  LEVEL OF CARE: SNF (31)  Routine Visit  CHIEF COMPLAINT:  Manage Alzheimer's dementia, diabetes mellitus and hypertension  HISTORY OF PRESENT ILLNESS:  REASSESSMENT OF ONGOING PROBLEM(S):  HTN: Pt 's HTN remains stable.  Staff Deny CP, sob, DOE, pedal edema, headaches, dizziness or visual disturbances.  No complications from the medications currently being used.  Last BP : 130/80, 114/61, 126/79, 134/73, 97/52, 128/70, 128/86, 126/63, 136/80, 122/60, 128/75, 124/55.  DM:pt's DM remains stable.  Staff deny polyuria, polydipsia, polyphagia, changes in vision or hypoglycemic episodes.  No complications noted from the medication presently being used.  Last hemoglobin A1c is: 7.3 in 2/14, in 8-14 hemoglobin A1c 7.8, in 11-14 hemoglobin A1c 8.1, in 2-15 hemoglobin A1c 10.6, 5-15 hemoglobin A1c 8.8  DEMENTIA: The dementia remaines stable and continues to function adequately in the current living environment with supervision.  The patient has had little changes in behavior. No complications noted from the medications presently being used. Patient is a poor historian.  PAST MEDICAL HISTORY : Reviewed.  No changes.  [facility]  REVIEW OF SYSTEMS: Unobtainable due to dementia  PHYSICAL EXAMINATION  VS:  See VS section     GENERAL: no acute distress, obese body habitus EYES: nl sclerae, nl conjunctivae, no discharge NECK: supple, trachea midline, no neck masses, no thyroid tenderness, no thyromegaly LYMPHATICS: No cervical lymphadenopathy, no supraclavicular lymphadenopathy RESPIRATORY: breathing is even & unlabored, BS CTAB CARDIAC: RRR, no murmur,no extra heart sounds, no edema GI: abdomen soft, normal BS, no masses, no tenderness, no hepatomegaly, no splenomegaly PSYCHIATRIC: the patient is alert & oriented to person, affect & behavior  appropriate  LABS/RADIOLOGY: 6-15 hemoglobin 10, MCV 82 otherwise CBC normal, glucose 156, total protein 5.7, albumin 3.2 otherwise CMP normal 4-15 fasting lipid panel normal, BMP normal, hemoglobin 10.2, MCV 82 otherwise CBC normal 2-15 hemoglobin 11.2, MCV 79.3 otherwise CBC normal  12-14 iron panel normal, ferritin 63, vitamin B12 level 562, RBC folate 952 11-14 glucose 228 otherwise BMP normal, triglycerides 200 otherwise fasting lipid panel normal, CBC normal, albumin 3.3 otherwise liver profile normal  8-14 triglycerides 175 otherwise fasting lipid panel normal, CBC normal, total protein 5.8, albumin 3.1 otherwise liver profile normal  5-14 glucose 121 otherwise BMP normal  2/14 MCV 76.5 otherwise CBC normal, HDL 50, LDL 179, total cholesterol 281, triglycerides 268, liver profile normal  ASSESSMENT/PLAN:  Alzheimer's dementia-advanced diabetes mellitus-uncontrolled. Lantus was increased. hypertension-well-controlled. Hyperlipidemia-well controlled. Lipitor was decreased. gemfibrozil was discontinued. hypokalemia -- well repleted  CPT CODE: 1610999309  Newton PiggGayani Y. Kerry Doryasanayaka, MD [facility] [phone]

## 2013-09-19 ENCOUNTER — Non-Acute Institutional Stay (SKILLED_NURSING_FACILITY): Payer: PRIVATE HEALTH INSURANCE | Admitting: Internal Medicine

## 2013-09-19 DIAGNOSIS — F0280 Dementia in other diseases classified elsewhere without behavioral disturbance: Secondary | ICD-10-CM

## 2013-09-19 DIAGNOSIS — G309 Alzheimer's disease, unspecified: Principal | ICD-10-CM

## 2013-09-19 DIAGNOSIS — E7800 Pure hypercholesterolemia, unspecified: Secondary | ICD-10-CM

## 2013-09-19 DIAGNOSIS — I10 Essential (primary) hypertension: Secondary | ICD-10-CM

## 2013-09-19 DIAGNOSIS — E1165 Type 2 diabetes mellitus with hyperglycemia: Secondary | ICD-10-CM

## 2013-09-19 DIAGNOSIS — IMO0001 Reserved for inherently not codable concepts without codable children: Secondary | ICD-10-CM

## 2013-09-20 NOTE — Progress Notes (Signed)
         PROGRESS NOTE  DATE: 09-19-13  FACILITY: Nursing Home Location: [facility]  LEVEL OF CARE: SNF (31)  Routine Visit  CHIEF COMPLAINT:  Manage Alzheimer's dementia, diabetes mellitus and hypertension  HISTORY OF PRESENT ILLNESS:  REASSESSMENT OF ONGOING PROBLEM(S):  HTN: Pt 's HTN remains stable.  Staff Deny CP, sob, DOE, pedal edema, headaches, dizziness or visual disturbances.  No complications from the medications currently being used.  Last BP : 130/80, 114/61, 126/79, 134/73, 97/52, 128/70, 128/86, 126/63, 136/80, 122/60, 128/75, 124/55, 123/48.  DM:pt's DM remains stable.  Staff deny polyuria, polydipsia, polyphagia, changes in vision or hypoglycemic episodes.  No complications noted from the medication presently being used.  Last hemoglobin A1c is: 7.3 in 2/14, in 8-14 hemoglobin A1c 7.8, in 11-14 hemoglobin A1c 8.1, in 2-15 hemoglobin A1c 10.6, 5-15 hemoglobin A1c 8.8  DEMENTIA: The dementia remaines stable and continues to function adequately in the current living environment with supervision.  The patient has had little changes in behavior. No complications noted from the medications presently being used. Patient is a poor historian.  PAST MEDICAL HISTORY : Reviewed.  No changes.  [facility]  REVIEW OF SYSTEMS: Unobtainable due to dementia  PHYSICAL EXAMINATION  VS:  See VS section     GENERAL: no acute distress, obese body habitus EYES: nl sclerae, nl conjunctivae, no discharge NECK: supple, trachea midline, no neck masses, no thyroid tenderness, no thyromegaly LYMPHATICS: No cervical lymphadenopathy, no supraclavicular lymphadenopathy RESPIRATORY: breathing is even & unlabored, BS CTAB CARDIAC: RRR, no murmur,no extra heart sounds, no edema GI: abdomen soft, normal BS, no masses, no tenderness, no hepatomegaly, no splenomegaly PSYCHIATRIC: the patient is alert & oriented to person, affect & behavior  appropriate  LABS/RADIOLOGY: 6-15 hemoglobin 10, MCV 82 otherwise CBC normal, glucose 156, total protein 5.7, albumin 3.2 otherwise CMP normal 4-15 fasting lipid panel normal, BMP normal, hemoglobin 10.2, MCV 82 otherwise CBC normal 2-15 hemoglobin 11.2, MCV 79.3 otherwise CBC normal  12-14 iron panel normal, ferritin 63, vitamin B12 level 562, RBC folate 952 11-14 glucose 228 otherwise BMP normal, triglycerides 200 otherwise fasting lipid panel normal, CBC normal, albumin 3.3 otherwise liver profile normal  8-14 triglycerides 175 otherwise fasting lipid panel normal, CBC normal, total protein 5.8, albumin 3.1 otherwise liver profile normal  5-14 glucose 121 otherwise BMP normal  2/14 MCV 76.5 otherwise CBC normal, HDL 50, LDL 179, total cholesterol 281, triglycerides 268, liver profile normal  ASSESSMENT/PLAN:  Alzheimer's dementia-advanced diabetes mellitus-uncontrolled. Metformin was started. Check hemoglobin A1c. hypertension-well-controlled. Hyperlipidemia-well controlled.  hypokalemia -- well repleted UTI-Cipro was started  CPT CODE: 1610999309  Newton PiggGayani Y. Kerry Doryasanayaka, MD [facility] [phone]

## 2013-10-10 ENCOUNTER — Non-Acute Institutional Stay (SKILLED_NURSING_FACILITY): Payer: PRIVATE HEALTH INSURANCE | Admitting: Internal Medicine

## 2013-10-10 DIAGNOSIS — E1165 Type 2 diabetes mellitus with hyperglycemia: Secondary | ICD-10-CM

## 2013-10-10 DIAGNOSIS — E7800 Pure hypercholesterolemia, unspecified: Secondary | ICD-10-CM

## 2013-10-10 DIAGNOSIS — IMO0001 Reserved for inherently not codable concepts without codable children: Secondary | ICD-10-CM

## 2013-10-10 DIAGNOSIS — G309 Alzheimer's disease, unspecified: Principal | ICD-10-CM

## 2013-10-10 DIAGNOSIS — I10 Essential (primary) hypertension: Secondary | ICD-10-CM

## 2013-10-10 DIAGNOSIS — F0280 Dementia in other diseases classified elsewhere without behavioral disturbance: Secondary | ICD-10-CM

## 2013-10-12 NOTE — Progress Notes (Signed)
         PROGRESS NOTE  DATE: 10-10-13  FACILITY: Nursing Home Location: [facility]  LEVEL OF CARE: SNF (31)  Routine Visit  CHIEF COMPLAINT:  Manage Alzheimer's dementia, diabetes mellitus and hypertension  HISTORY OF PRESENT ILLNESS:  REASSESSMENT OF ONGOING PROBLEM(S):  HTN: Pt 's HTN remains stable.  Staff Deny CP, sob, DOE, pedal edema, headaches, dizziness or visual disturbances.  No complications from the medications currently being used.  Last BP : 130/80, 114/61, 126/79, 134/73, 97/52, 128/70, 128/86, 126/63, 136/80, 122/60, 128/75, 124/55, 123/48, 129/98.  DM:pt's DM remains stable.  Staff deny polyuria, polydipsia, polyphagia, changes in vision or hypoglycemic episodes.  No complications noted from the medication presently being used.  Last hemoglobin A1c is: 7.3 in 2/14, in 8-14 hemoglobin A1c 7.8, in 11-14 hemoglobin A1c 8.1, in 2-15 hemoglobin A1c 10.6, 5-15 hemoglobin A1c 8.8, in 8-15 hemoglobin A1c 7.6  DEMENTIA: The dementia remaines stable and continues to function adequately in the current living environment with supervision.  The patient has had little changes in behavior. No complications noted from the medications presently being used. Patient is a poor historian.  PAST MEDICAL HISTORY : Reviewed.  No changes.  CURRENT MEDICATIONS: Reviewed per [facility]  REVIEW OF SYSTEMS: Unobtainable due to dementia  PHYSICAL EXAMINATION  VS:  See VS section     GENERAL: no acute distress, obese body habitus EYES: nl sclerae, nl conjunctivae, no discharge NECK: supple, trachea midline, no neck masses, no thyroid tenderness, no thyromegaly LYMPHATICS: No cervical lymphadenopathy, no supraclavicular lymphadenopathy RESPIRATORY: breathing is even & unlabored, BS CTAB CARDIAC: RRR, no murmur,no extra heart sounds, no edema GI: abdomen soft, normal BS, no masses, no tenderness, no hepatomegaly, no splenomegaly PSYCHIATRIC: the patient is alert & oriented to  person, affect & behavior appropriate  LABS/RADIOLOGY: 8-15 hemoglobin 10.6, MCV 81.9 otherwise CBC normal, glucose 41 otherwise BMP normal 6-15 hemoglobin 10, MCV 82 otherwise CBC normal, glucose 156, total protein 5.7, albumin 3.2 otherwise CMP normal 4-15 fasting lipid panel normal, BMP normal, hemoglobin 10.2, MCV 82 otherwise CBC normal 2-15 hemoglobin 11.2, MCV 79.3 otherwise CBC normal  12-14 iron panel normal, ferritin 63, vitamin B12 level 562, RBC folate 952 11-14 glucose 228 otherwise BMP normal, triglycerides 200 otherwise fasting lipid panel normal, CBC normal, albumin 3.3 otherwise liver profile normal  8-14 triglycerides 175 otherwise fasting lipid panel normal, CBC normal, total protein 5.8, albumin 3.1 otherwise liver profile normal  5-14 glucose 121 otherwise BMP normal  2/14 MCV 76.5 otherwise CBC normal, HDL 50, LDL 179, total cholesterol 281, triglycerides 268, liver profile normal  ASSESSMENT/PLAN:  Alzheimer's dementia-advanced diabetes mellitus-uncontrolled. Metformin was started.  hypertension-well-controlled. Hyperlipidemia-well controlled.  hypokalemia -- well repleted  CPT CODE: 16109  Newton Pigg. Kerry Dory, MD [facility] [phone]

## 2013-11-07 ENCOUNTER — Non-Acute Institutional Stay (SKILLED_NURSING_FACILITY): Payer: PRIVATE HEALTH INSURANCE | Admitting: Internal Medicine

## 2013-11-07 DIAGNOSIS — I10 Essential (primary) hypertension: Secondary | ICD-10-CM

## 2013-11-07 DIAGNOSIS — G309 Alzheimer's disease, unspecified: Secondary | ICD-10-CM

## 2013-11-07 DIAGNOSIS — F0280 Dementia in other diseases classified elsewhere without behavioral disturbance: Secondary | ICD-10-CM

## 2013-11-07 DIAGNOSIS — E119 Type 2 diabetes mellitus without complications: Secondary | ICD-10-CM

## 2013-11-07 DIAGNOSIS — E7800 Pure hypercholesterolemia, unspecified: Secondary | ICD-10-CM

## 2013-11-08 ENCOUNTER — Other Ambulatory Visit: Payer: Self-pay | Admitting: *Deleted

## 2013-11-08 MED ORDER — CLONAZEPAM 0.5 MG PO TABS: ORAL_TABLET | ORAL | Status: DC

## 2013-11-08 NOTE — Telephone Encounter (Signed)
[facility] 

## 2013-11-09 DIAGNOSIS — E119 Type 2 diabetes mellitus without complications: Secondary | ICD-10-CM | POA: Insufficient documentation

## 2013-11-09 NOTE — Progress Notes (Signed)
         PROGRESS NOTE  DATE: 11-07-13  FACILITY: Nursing Home Location: [facility]  LEVEL OF CARE: SNF (31)  Routine Visit  CHIEF COMPLAINT:  Manage Alzheimer's dementia, diabetes mellitus and hypertension  HISTORY OF PRESENT ILLNESS:  REASSESSMENT OF ONGOING PROBLEM(S):  HTN: Pt 's HTN remains stable.  Staff Deny CP, sob, DOE, pedal edema, headaches, dizziness or visual disturbances.  No complications from the medications currently being used.  Last BP : 130/80, 114/61, 126/79, 134/73, 97/52, 128/70, 128/86, 126/63, 136/80, 122/60, 128/75, 124/55, 123/48, 129/98, 120/55.  DM:pt's DM remains stable.  Staff deny polyuria, polydipsia, polyphagia, changes in vision or hypoglycemic episodes.  No complications noted from the medication presently being used.  Last hemoglobin A1c is: 7.3 in 2/14, in 8-14 hemoglobin A1c 7.8, in 11-14 hemoglobin A1c 8.1, in 2-15 hemoglobin A1c 10.6, 5-15 hemoglobin A1c 8.8, in 8-15 hemoglobin A1c 7.6  DEMENTIA: The dementia remaines stable and continues to function adequately in the current living environment with supervision.  The patient has had little changes in behavior. No complications noted from the medications presently being used. Patient is a poor historian.  PAST MEDICAL HISTORY : Reviewed.  No changes.  [facility]: [facility]  REVIEW OF SYSTEMS: Unobtainable due to dementia  PHYSICAL EXAMINATION  VS:  See VS section     GENERAL: no acute distress, obese body habitus EYES: nl sclerae, nl conjunctivae, no discharge NECK: supple, trachea midline, no neck masses, no thyroid tenderness, no thyromegaly LYMPHATICS: No cervical lymphadenopathy, no supraclavicular lymphadenopathy RESPIRATORY: breathing is even & unlabored, BS CTAB CARDIAC: RRR, no murmur,no extra heart sounds, no edema GI: abdomen soft, normal BS, no masses, no tenderness, no hepatomegaly, no splenomegaly PSYCHIATRIC: the patient is alert & oriented  to person, affect & behavior appropriate  LABS/RADIOLOGY: 8-15 hemoglobin 10.6, MCV 81.9 otherwise CBC normal, glucose 41 otherwise BMP normal 6-15 hemoglobin 10, MCV 82 otherwise CBC normal, glucose 156, total protein 5.7, albumin 3.2 otherwise CMP normal 4-15 fasting lipid panel normal, BMP normal, hemoglobin 10.2, MCV 82 otherwise CBC normal 2-15 hemoglobin 11.2, MCV 79.3 otherwise CBC normal  12-14 iron panel normal, ferritin 63, vitamin B12 level 562, RBC folate 952 11-14 glucose 228 otherwise BMP normal, triglycerides 200 otherwise fasting lipid panel normal, CBC normal, albumin 3.3 otherwise liver profile normal  8-14 triglycerides 175 otherwise fasting lipid panel normal, CBC normal, total protein 5.8, albumin 3.1 otherwise liver profile normal  5-14 glucose 121 otherwise BMP normal  2/14 MCV 76.5 otherwise CBC normal, HDL 50, LDL 179, total cholesterol 281, triglycerides 268, liver profile normal  ASSESSMENT/PLAN:  Alzheimer's dementia-advanced diabetes mellitus-uncontrolled. Metformin was started.  hypertension-well-controlled. Hyperlipidemia-well controlled.  hypokalemia -- well repleted  CPT CODE: 5409899309  Marisa PiggGayani Y. Kerry Doryasanayaka, MD [facility] [phone]

## 2013-12-17 LAB — CBC AND DIFFERENTIAL: WBC: 9.3 10*3/mL

## 2013-12-23 ENCOUNTER — Non-Acute Institutional Stay (SKILLED_NURSING_FACILITY): Payer: PRIVATE HEALTH INSURANCE | Admitting: Internal Medicine

## 2013-12-23 DIAGNOSIS — G309 Alzheimer's disease, unspecified: Secondary | ICD-10-CM

## 2013-12-23 DIAGNOSIS — G250 Essential tremor: Secondary | ICD-10-CM

## 2013-12-23 DIAGNOSIS — E118 Type 2 diabetes mellitus with unspecified complications: Secondary | ICD-10-CM | POA: Insufficient documentation

## 2013-12-23 DIAGNOSIS — E7800 Pure hypercholesterolemia, unspecified: Secondary | ICD-10-CM

## 2013-12-23 DIAGNOSIS — R251 Tremor, unspecified: Secondary | ICD-10-CM | POA: Insufficient documentation

## 2013-12-23 DIAGNOSIS — F0280 Dementia in other diseases classified elsewhere without behavioral disturbance: Secondary | ICD-10-CM

## 2013-12-23 DIAGNOSIS — F259 Schizoaffective disorder, unspecified: Secondary | ICD-10-CM

## 2013-12-23 DIAGNOSIS — I10 Essential (primary) hypertension: Secondary | ICD-10-CM

## 2013-12-23 NOTE — Progress Notes (Signed)
Patient ID: Marisa Scott, female   DOB: 06/20/1929, 80 y.o.   MRN: [id number]     [facility]  Chief Complaint  Patient presents with  . Medical Management of Chronic Issues   No Known Allergies  HPI 80 y/o female pt is seen for routine visit. She has hx of dementia, DM, schizoaffective disorder, HTN among others. She has been at her baseline as per staff. Pt denies any concerns. Her namenda was recently changed by NP to namenda xr. Reviewed notes from 12/16/13. Changes made to her insulin where her lantus was increased from 55 u and 35 u to 60 u in am and 40 u in pm on 12/16/13 but on review a1c 6.9 on 12/17/13.   ROS Limited with her dementia  Denies fever or chills Denies chest pain and dyspnea Denies headache Denies new weakness On wheelchair No falls reported  Past Medical History  Diagnosis Date  . Hypertension   . Dementia   . Schizophrenia   . Bipolar 1 disorder   . Anxiety   . Alzheimer's disease   . DMII (diabetes mellitus, type 2)   . Osteoporosis   . Hyperlipidemia   . Esophageal reflux   . Constipation   . Anemia   . Hypokalemia   . Tremors of nervous system   . Parkinson disease   . Venous insufficiency   . Osteoarthritis   . Venous insufficiency      Medication List       This list is accurate as of: 12/23/13  5:42 PM.  Always use your most recent med list.               acetaminophen 325 MG tablet  Commonly known as:  TYLENOL  Take 650 mg by mouth every 4 (four) hours as needed for mild pain.     atorvastatin 40 MG tablet  Commonly known as:  LIPITOR  Take 20 mg by mouth at bedtime.     benazepril 10 MG tablet  Commonly known as:  LOTENSIN  Take 10 mg by mouth daily.     cholecalciferol 1000 UNITS tablet  Commonly known as:  VITAMIN D  Take 1,000 Units by mouth daily. For Senile Osteoporosis     clonazePAM 0.5 MG tablet  Commonly known as:  KLONOPIN  Take 1/2 tablet by mouth twice daily     DECUBI-VITE PO  Take by mouth. 1 by mouth daily for step II Ulcer     donepezil 10 MG tablet  Commonly known as:  ARICEPT  Take 10 mg by mouth at bedtime.     insulin glargine 100 UNIT/ML injection  Commonly known as:  LANTUS  Inject 40-60 Units into the skin. 60 u am and 40 u bedtime     insulin lispro 100 UNIT/ML injection  Commonly known as:  HUMALOG  Inject 3 Units into the skin daily before supper.     INVEGA SUSTENNA 117 MG/0.75ML Susp  Generic drug:  Paliperidone Palmitate     metFORMIN 500 MG tablet  Commonly known as:  GLUCOPHAGE  Take 250 mg by mouth daily with breakfast.     mirtazapine 30 MG disintegrating tablet  Commonly known as:  REMERON SOL-TAB  Take 30 mg by mouth at bedtime.     NAMENDA XR 21 MG Cp24  Generic drug:  Memantine HCl ER  Take 1 capsule by mouth daily.     primidone 50 MG tablet  Commonly known as:  MYSOLINE  Take 50 mg by mouth 3 (three) times daily.     SYSTANE FREE OP  Apply 1 drop to eye 3 (three) times daily.     UTI-STAT Liqd  Take 30 mLs by mouth every morning.       Physical exam BP 123/77 mmHg  Pulse 97  Temp(Src) 97.5 F (36.4 C)  Resp 18  SpO2 99%  GENERAL: no acute distress, obese body habitus EYES: normal sclera and conjunctivae, no discharge NECK: supple, trachea midline, no neck masses, no thyromegaly LYMPHATICS: No cervical lymphadenopathy RESPIRATORY: CTAB CARDIAC: RRR, no murmur,, no leg edema GI: abdomen soft, normal BS, no masses, no tenderness PSYCHIATRIC: alert, normal mood EXTREMITIES: able to move all 4 extremities, on wheelchair  Labs 12/17/13 a1c 6.9, wbc 9.3, hb 11.0, hct 35.4, plt 203, na 140, k 3.7, bun 15, cr 0.7, lft wnl, prealb 23.8, t.chol 144, hdl 37, ldl 62, tg 227, urine microalb/cr ratio 1.4 09/18/13 wbc 9.1, hb 10.6, hct 34.9, plt 210, a1c 7.6, na 140, k 3.9, glu 41, bun 20, cr 1.1  ASSESSMENT/PLAN:  DM type 2 a1c suggestive of better controlled dm over 3 months. On lantus 60 u am  and 40 u pm with metformin 250 mg qd at present. D/c metformin as no benefit from dosing of 250 mg daily. Change lantus to 50 u in am and 40 u in pm. Monitor cbg. Goal a1c 7-7.5. Monitor for hypoglycemia. ldl at goal. Continue benazepril and lipitor. Continue pre-dinner humalog for cbg > 250. If cbg remains mostly below 200, consider reducing the dose of lantus  HTN bp controlled. Continue benazepril 10 mg daily  Alzheimer's dementia Advanced. Continue namenda xr and donepezil current regimen  Essential tremors Continue primidone for now  Schizoaffective disorder Continue invega injection and klonopin current regimen  HLD Continue lipitor

## 2014-02-26 ENCOUNTER — Non-Acute Institutional Stay (SKILLED_NURSING_FACILITY): Payer: Medicare Other | Admitting: Internal Medicine

## 2014-02-26 DIAGNOSIS — F259 Schizoaffective disorder, unspecified: Secondary | ICD-10-CM | POA: Diagnosis not present

## 2014-02-26 DIAGNOSIS — E118 Type 2 diabetes mellitus with unspecified complications: Secondary | ICD-10-CM | POA: Diagnosis not present

## 2014-02-26 DIAGNOSIS — I10 Essential (primary) hypertension: Secondary | ICD-10-CM

## 2014-02-26 DIAGNOSIS — E559 Vitamin D deficiency, unspecified: Secondary | ICD-10-CM | POA: Diagnosis not present

## 2014-02-26 NOTE — Progress Notes (Signed)
Patient ID: Marisa Scott, female   DOB: 06/20/1929, 81 y.o.   MRN: [id number]    [name]  Allergies reviewed  Cc- medical management of chronic issues  HPI 81 y/o female patient is seen for routine visit. She has hx of dementia, DM, schizoaffective disorder, HTN among others. Her cbg has been running low in am and above 200 in the night. Currently on lantus 46 u in am and 36 u in pm. Last a1c 6.9 on 12/16/13. Mood stable on current psych medications. SBP on review 118-120s. Takes her pills crushed  ROS Limited with her dementia. On wheelchair  Past medical history reviewed  Medication reviewed. [facility]  Physical exam BP 100/50 mmHg  Pulse 88  Temp(Src) 98.4 F (36.9 C)  Resp 18  SpO2 96%  General- elderly female in no acute distress, obese Head- atraumatic, normocephalic, scab on forehead noted Neck- no lymphadenopathy Mouth- normal mucus membrane Cardiovascular- normal s1,s2, no murmurs, no leg edema, diminished pedal pulses Respiratory- bilateral clear to auscultation, no wheeze, no rhonchi, no crackles Abdomen- bowel sounds present, soft, non tender Musculoskeletal- able to move all 4 extremities, no contractures Psychiatry- pleasantly confused  Labs 01/28/14 t.chol 150, hdl 51, ldl 75, tg 118 12/17/13 a1c 6.9, wbc 9.3, hb 11.0, hct 35.4, plt 203, na 140, k 3.7, bun 15, cr 0.7, lft wnl, prealb 23.8, t.chol 144, hdl 37, ldl 62, tg 227, urine microalb/cr ratio 1.4 09/18/13 wbc 9.1, hb 10.6, hct 34.9, plt 210, a1c 7.6, na 140, k 3.9, glu 41, bun 20, cr 1.1  ASSESSMENT/PLAN  Dm type 2 Change lantus to 35 u bid and continue novolog 3 u with dinner for cbg > 200 for now Monitor cbg  HTN bp on lower side today. Monitor clinically, other bp on review stable. Continue benazepril 10 mg daily, change norvasc to 2.5 mg daily for now and consider d/c if bp remains < 140/90 on next month bp review   Schizophrenia Continue monthly  invega injection  Vit d def contnue oscal and vit d supplement

## 2014-03-06 ENCOUNTER — Other Ambulatory Visit: Payer: Self-pay | Admitting: *Deleted

## 2014-03-06 MED ORDER — CLONAZEPAM 0.5 MG PO TABS
ORAL_TABLET | ORAL | Status: DC
Start: 2014-03-06 — End: 2015-07-03

## 2014-03-06 NOTE — Telephone Encounter (Signed)
[facility] 

## 2014-03-28 ENCOUNTER — Non-Acute Institutional Stay (SKILLED_NURSING_FACILITY): Payer: Medicare Other | Admitting: Internal Medicine

## 2014-03-28 DIAGNOSIS — F0393 Unspecified dementia, unspecified severity, with mood disturbance: Secondary | ICD-10-CM

## 2014-03-28 DIAGNOSIS — F0280 Dementia in other diseases classified elsewhere without behavioral disturbance: Secondary | ICD-10-CM

## 2014-03-28 DIAGNOSIS — G250 Essential tremor: Secondary | ICD-10-CM | POA: Insufficient documentation

## 2014-03-28 DIAGNOSIS — F329 Major depressive disorder, single episode, unspecified: Secondary | ICD-10-CM

## 2014-03-28 DIAGNOSIS — I10 Essential (primary) hypertension: Secondary | ICD-10-CM

## 2014-03-28 DIAGNOSIS — E876 Hypokalemia: Secondary | ICD-10-CM

## 2014-03-28 DIAGNOSIS — F411 Generalized anxiety disorder: Secondary | ICD-10-CM | POA: Insufficient documentation

## 2014-03-28 NOTE — Progress Notes (Signed)
Patient ID: Marisa Scott, female   DOB: 06/20/1929, 81 y.o.   MRN: [id number]    [facility]  Chief Complaint  Patient presents with  . Medical Management of Chronic Issues   No Known Allergies  HPI 81 y/o female patient is seen for routine visit. She is here for long term care, is at her baseline with no new concerns from staff. She is tolerating namenda xr 28 mg daily well. Her lantus had ben decreased to 35 u bid a month back. bp reading is stable. Reviewed her medications She has hx of dementia, DM, schizoaffective disorder, HTN among others.    ROS Limited with her dementia On wheelchair No falls reported No new skin concerns as per staff  Past Medical History  Diagnosis Date  . Hypertension   . Dementia   . Schizophrenia   . Bipolar 1 disorder   . Anxiety   . Alzheimer's disease   . DMII (diabetes mellitus, type 2)   . Osteoporosis   . Hyperlipidemia   . Esophageal reflux   . Constipation   . Anemia   . Hypokalemia   . Tremors of nervous system   . Parkinson disease   . Venous insufficiency   . Osteoarthritis   . Venous insufficiency    Medication [facility]. [facility]  Physical exam BP 110/57 mmHg  Pulse 89  Temp(Src) 97.5 F (36.4 C)  Resp 18  SpO2 96%  General- elderly female in no acute distress, obese Head- atraumatic, normocephalic Eyes- no pallor, no icterus, no discharge Neck- no lymphadenopathy Mouth- normal mucus membrane Cardiovascular- normal s1,s2, no murmurs, no leg edema, diminished pedal pulses Respiratory- bilateral clear to auscultation, no wheeze, no rhonchi, no crackles Abdomen- bowel sounds present, soft, non tender Musculoskeletal- able to move all 4 extremities, no contractures Neurological- no focal deficit Skin- warm and dry Psychiatry- pleasantly confused  Labs 01/28/14 t.chol 150, hdl 51, ldl 75, tg 118 12/17/13 a1c 6.9, wbc 9.3, hb 11.0, hct 35.4, plt 203, na 140, k 3.7, bun 15, cr  0.7, lft wnl, prealb 23.8, t.chol 144, hdl 37, ldl 62, tg 227, urine microalb/cr ratio 1.4 09/18/13 wbc 9.1, hb 10.6, hct 34.9, plt 210, a1c 7.6, na 140, k 3.9, glu 41, bun 20, cr 1.1  ASSESSMENT/PLAN:  HTN bp controlled. Continue benazepril 10 mg daily and norvasc 2.5 mg daily  Hypokalemia Continue kcl supplement 20 meq daily, check bmp and if k> 4, will d/c kcl  Anxiety Continue klonopin 0.25 mg bid  Essential Tremors Stable, continue mysoline 50 mg tid  Depression with dementia Stable, continue remeron 30 mg daily

## 2014-03-31 LAB — BASIC METABOLIC PANEL
BUN: 17 mg/dL (ref 4–21)
Glucose: 69 mg/dL
POTASSIUM: 4.1 mmol/L (ref 3.4–5.3)
Sodium: 138 mmol/L (ref 137–147)

## 2014-04-10 LAB — TSH: TSH: 2.45 u[IU]/mL (ref <=5.90)

## 2014-04-10 LAB — HEMOGLOBIN A1C: Hgb A1c MFr Bld: 7.7 % — AB (ref 4.0–6.0)

## 2014-04-17 ENCOUNTER — Non-Acute Institutional Stay (SKILLED_NURSING_FACILITY): Payer: Medicare Other | Admitting: Internal Medicine

## 2014-04-17 DIAGNOSIS — F0391 Unspecified dementia with behavioral disturbance: Secondary | ICD-10-CM | POA: Diagnosis not present

## 2014-04-17 DIAGNOSIS — E118 Type 2 diabetes mellitus with unspecified complications: Secondary | ICD-10-CM | POA: Diagnosis not present

## 2014-04-17 DIAGNOSIS — E876 Hypokalemia: Secondary | ICD-10-CM

## 2014-04-17 DIAGNOSIS — I10 Essential (primary) hypertension: Secondary | ICD-10-CM

## 2014-04-17 DIAGNOSIS — G250 Essential tremor: Secondary | ICD-10-CM

## 2014-04-17 DIAGNOSIS — L89152 Pressure ulcer of sacral region, stage 2: Secondary | ICD-10-CM | POA: Diagnosis not present

## 2014-04-17 DIAGNOSIS — F03918 Unspecified dementia, unspecified severity, with other behavioral disturbance: Secondary | ICD-10-CM

## 2014-04-17 NOTE — Progress Notes (Signed)
Patient ID: [name], female   DOB: 06/20/1929, [age] y.o.   MRN: [id number]    [name]  Chief complaint: medical management of chronic illness  Allergies: reviewed. NKDA  Code status: full code  HPI 81 y/o female patient is seen for routine visit. She has been at her baseline with no new concerns from staff. Her behavior has been stable. Her tremors are under control with mysoline. No falls reported. She has developed a stage 2 sacral pressure ulcer and is getting sacral pressure ulcer treatment.   She is getting nutritional supplement for her protein malnutrition. With low bp reading, her benazepril dosing has been reduced and her lantus dosing further adjusted. On review bp readings are on lower side. She has hx of dementia, DM, schizoaffective disorder, HTN among others.    ROS Limited with her dementia On wheelchair No falls reported  Past medical history reviewed  Medication [facility]. [facility]  Physical exam BP 110/53 mmHg  Pulse 84  Temp(Src) 98 F (36.7 C)  Resp 18  SpO2 98%  General- elderly female in no acute distress, obese Head- atraumatic, normocephalic Eyes- no pallor, no icterus, no discharge Neck- no lymphadenopathy Mouth- normal mucus membrane Cardiovascular- normal s1,s2, no murmurs, no leg edema, diminished pedal pulses Respiratory- bilateral clear to auscultation, no wheeze, no rhonchi, no crackles Abdomen- bowel sounds present, soft, non tender Musculoskeletal- able to move all 4 extremities, no contractures Neurological- no focal deficit Skin- warm and dry, stage 2 sacral pressure ulcer Psychiatry- pleasantly confused  Labs 04/10/14 a1c 7.7 03/31/14 na 138, k 4.1, glucose 69, bun 17, cr 1.09, ca 8.9 01/28/14 t.chol 150, hdl 51, ldl 75, tg 118 12/17/13 a1c 6.9, wbc 9.3, hb 11.0, hct 35.4, plt 203, na 140, k 3.7, bun 15, cr 0.7, lft wnl, prealb 23.8, t.chol 144, hdl 37, ldl 62, tg 227, urine  microalb/cr ratio 1.4 09/18/13 wbc 9.1, hb 10.6, hct 34.9, plt 210, a1c 7.6, na 140, k 3.9, glu 41, bun 20, cr 1.1  ASSESSMENT/PLAN  Stage 2 sacral pressure ulcer Continue skin care. Pressure ulcer prophylaxis. Continue fortified food. Add decubvite 1 tab daily.  Dm type 2 Continue lantus 35 u in am and 30 u in pm with novolog 3 u before dinner. Monitor cbg  HTN bp readings reviewed. Low bp readings. D/c norvasc. Continue benazepril 5 mg daily. Monitor bp daily for a week and then routine check  Dementia with behavioral disturbance Stable, continue namenda xr 28 mg daily, aricept 10 mg daily, remeron 30 mg daily with klonopin 0.25 mg bid  Hypokalemia With stable k level, dc kdur supplement for now.  Essential Tremors Stable, continue mysoline 50 mg tid

## 2014-05-14 ENCOUNTER — Non-Acute Institutional Stay (SKILLED_NURSING_FACILITY): Payer: Medicare Other | Admitting: Internal Medicine

## 2014-05-14 DIAGNOSIS — I10 Essential (primary) hypertension: Secondary | ICD-10-CM | POA: Diagnosis not present

## 2014-05-14 DIAGNOSIS — E46 Unspecified protein-calorie malnutrition: Secondary | ICD-10-CM

## 2014-05-14 DIAGNOSIS — F259 Schizoaffective disorder, unspecified: Secondary | ICD-10-CM

## 2014-05-14 NOTE — Progress Notes (Signed)
Patient ID: [name], female   DOB: 06/20/1929, 81 y.o.   MRN: [id number]    [facility]  Chief complaint: medical management of chronic illness  Allergies: reviewed. NKDA  Code status: full code  HPI 81 y/o female patient is seen for routine visit. She has been at her baseline with no new concerns from staff. Her behavior has been stable. No falls reported. Continue to receive skin care. She is getting nutritional supplement for her protein malnutrition. She has hx of dementia, DM, schizoaffective disorder, HTN among others.    ROS Unable to obtain. Limited with her dementia On wheelchair No falls reported  Past medical history reviewed  Medication reviewed. See Marisa Scott   Medication List       This list is accurate as of: 05/14/14  4:26 PM.  Always use your most recent med list.               acetaminophen 325 MG tablet  Commonly known as:  TYLENOL  Take 650 mg by mouth every 4 (four) hours as needed for mild pain.     atorvastatin 20 MG tablet  Commonly known as:  LIPITOR  Take by mouth daily. Take 1 tablet by mouth every evening for hyperlipidemia     benazepril 5 MG tablet  Commonly known as:  LOTENSIN  Take by mouth daily. Take 1 tablet by mouth daily     cholecalciferol 1000 UNITS tablet  Commonly known as:  VITAMIN D  Take 1,000 Units by mouth daily. For Senile Osteoporosis     clonazePAM 0.5 MG tablet  Commonly known as:  KLONOPIN  Take 1/2 tablet by mouth twice daily for mood     DECUBI-VITE PO  Take by mouth. 1 by mouth daily for step II Ulcer     donepezil 10 MG tablet  Commonly known as:  ARICEPT  Take 10 mg by mouth at bedtime.     fenofibrate 48 MG tablet  Commonly known as:  TRICOR  Take by mouth daily. Take 1 tablet every evening for hyperlipidemia     insulin glargine 100 UNIT/ML injection  Commonly known as:  LANTUS  Inject 30-35 Units into the skin. 35 units at bedtime and 30 u in am       INVEGA SUSTENNA 117 MG/0.75ML Susp  Generic drug:  Paliperidone Palmitate  Inject 0.75 ml= 117 mg intramuscularly once a month on the 4th for schizaffective disorder     Lavender Oil Oil  by Does not apply route. Apply 1 drop behind each ear every night at bedtime as needed for relaxation     memantine 28 MG Cp24 24 hr capsule  Commonly known as:  NAMENDA XR  Take by mouth. Take 1 tablet by mouth daily for dementia * do not crush or chew     mirtazapine 30 MG disintegrating tablet  Commonly known as:  REMERON SOL-TAB  Take 30 mg by mouth at bedtime.     Oyster Shell Calcium 500 MG Tabs  Take by mouth. Take 1 tablet by mouth twice daily for supplement     primidone 50 MG tablet  Commonly known as:  MYSOLINE  Take 50 mg by mouth 3 (three) times daily.     SYSTANE FREE OP  Apply 1 drop to eye 3 (three) times daily.     UTI-STAT Liqd  Take 30 mLs by mouth every morning.        Physical exam BP  120/59 mmHg  Pulse 85  Temp(Src) 97 F (36.1 C)  Resp 18  SpO2 97%  General- elderly female in no acute distress Head- atraumatic, normocephalic Eyes- no pallor, no icterus, no discharge Neck- no lymphadenopathy Mouth- normal mucus membrane Cardiovascular- normal s1,s2, no murmurs, no leg edema, diminished pedal pulses Respiratory- bilateral clear to auscultation, no wheeze, no rhonchi, no crackles Abdomen- bowel sounds present, soft, non tender Musculoskeletal- able to move all 4 extremities, no contractures Neurological- no focal deficit Skin- warm and dry, stage 2 sacral pressure ulcer Psychiatry- pleasantly confused, oriented to self  Labs 04/10/14 a1c 7.7 03/31/14 na 138, k 4.1, glucose 69, bun 17, cr 1.09, ca 8.9 01/28/14 t.chol 150, hdl 51, ldl 75, tg 118 12/17/13 a1c 6.9, wbc 9.3, hb 11.0, hct 35.4, plt 203, na 140, k 3.7, bun 15, cr 0.7, lft wnl, prealb 23.8, t.chol 144, hdl 37, ldl 62, tg 227, urine microalb/cr ratio 1.4 09/18/13 wbc 9.1, hb 10.6, hct 34.9, plt  210, a1c 7.6, na 140, k 3.9, glu 41, bun 20, cr 1.1  ASSESSMENT/PLAN  Protein calorie malnutrition Monitor her weight, continue medpass, decubivite supplement. Continue skin care with her stage 2 pressure ulcer. Continue pressure ulcer prophylaxis  HTN bp readings reviewed. Continue benazepril 5 mg daily. Monitor bp   Schizoaffective disorder Has dementia which can worsen this. Stable for now. Continue invega with remeron and klonopin

## 2014-06-11 ENCOUNTER — Non-Acute Institutional Stay (SKILLED_NURSING_FACILITY): Payer: Medicare Other | Admitting: Internal Medicine

## 2014-06-11 ENCOUNTER — Encounter: Payer: Self-pay | Admitting: Internal Medicine

## 2014-06-11 DIAGNOSIS — E785 Hyperlipidemia, unspecified: Secondary | ICD-10-CM

## 2014-06-11 DIAGNOSIS — E118 Type 2 diabetes mellitus with unspecified complications: Secondary | ICD-10-CM

## 2014-06-11 DIAGNOSIS — L8992 Pressure ulcer of unspecified site, stage 2: Secondary | ICD-10-CM

## 2014-06-11 DIAGNOSIS — G250 Essential tremor: Secondary | ICD-10-CM | POA: Diagnosis not present

## 2014-07-16 ENCOUNTER — Non-Acute Institutional Stay (SKILLED_NURSING_FACILITY): Payer: Medicare Other | Admitting: Internal Medicine

## 2014-07-16 ENCOUNTER — Encounter: Payer: Self-pay | Admitting: Internal Medicine

## 2014-07-16 DIAGNOSIS — E118 Type 2 diabetes mellitus with unspecified complications: Secondary | ICD-10-CM | POA: Diagnosis not present

## 2014-07-16 DIAGNOSIS — E46 Unspecified protein-calorie malnutrition: Secondary | ICD-10-CM

## 2014-07-16 DIAGNOSIS — F329 Major depressive disorder, single episode, unspecified: Secondary | ICD-10-CM

## 2014-07-16 DIAGNOSIS — E785 Hyperlipidemia, unspecified: Secondary | ICD-10-CM

## 2014-07-16 DIAGNOSIS — F0393 Unspecified dementia, unspecified severity, with mood disturbance: Secondary | ICD-10-CM

## 2014-07-16 DIAGNOSIS — F0280 Dementia in other diseases classified elsewhere without behavioral disturbance: Secondary | ICD-10-CM

## 2014-07-16 DIAGNOSIS — I10 Essential (primary) hypertension: Secondary | ICD-10-CM | POA: Diagnosis not present

## 2014-07-16 NOTE — Progress Notes (Signed)
Patient ID: Marisa Scott, female   DOB: 19-Aug-1929, 81 y.o.   MRN: [id number]    [facility]  Code Status: Full Code   Chief Complaint  Patient presents with  . Medical Management of Chronic Issues    Routine Visit    Allergies: reviewed. NKDA   HPI 81 y/o female patient is seen for routine visit. She has been at her baseline with no new concerns from staff. Sitting on her wheelchair. Her behavior has been stable. No falls reported. She has hx of dementia, DM, schizoaffective disorder, HTN among others.  weight stable compared to may 16.   ROS Unable to obtain. Limited with her dementia  Past medical history reviewed  Medication reviewed. See [facility]   Medication List       This list is accurate as of: 07/16/14  3:04 PM.  Always use your most recent med list.               acetaminophen 325 MG tablet  Commonly known as:  TYLENOL  Take 650 mg by mouth every 4 (four) hours as needed for mild pain.     AMBULATORY NON FORMULARY MEDICATION  Medication Name: Med Pass 90 mL twice daily for nutritional supplement     atorvastatin 20 MG tablet  Commonly known as:  LIPITOR  Take by mouth daily. Take 1 tablet by mouth every evening for hyperlipidemia     benazepril 5 MG tablet  Commonly known as:  LOTENSIN  Take by mouth daily. Take 1 tablet by mouth daily     cholecalciferol 1000 UNITS tablet  Commonly known as:  VITAMIN D  Take 1,000 Units by mouth daily. For Senile Osteoporosis     clonazePAM 0.5 MG tablet  Commonly known as:  KLONOPIN  Take 1/2 tablet by mouth twice daily for mood     DECUBI-VITE PO  Take by mouth. 1 by mouth daily for step II Ulcer     donepezil 10 MG tablet  Commonly known as:  ARICEPT  Take 10 mg by mouth at bedtime.     fenofibrate 48 MG tablet  Commonly known as:  TRICOR  Take by mouth daily. Take 1 tablet every evening for hyperlipidemia     insulin glargine 100 UNIT/ML injection  Commonly known as:  LANTUS  Inject  30-35 Units into the skin. 35 units in the morning and 30 units in the evening     INVEGA SUSTENNA 117 MG/0.75ML Susp  Generic drug:  Paliperidone Palmitate  Inject 0.75 ml= 117 mg intramuscularly once a month on the 4th for schizaffective disorder     Lavender Oil Oil  by Does not apply route. Apply 1 drop behind each ear every night at bedtime as needed for relaxation     memantine 28 MG Cp24 24 hr capsule  Commonly known as:  NAMENDA XR  Take by mouth. Take 1 tablet by mouth daily for dementia * do not crush or chew     mirtazapine 30 MG disintegrating tablet  Commonly known as:  REMERON SOL-TAB  Take 30 mg by mouth at bedtime.     primidone 50 MG tablet  Commonly known as:  MYSOLINE  Take 50 mg by mouth 3 (three) times daily.     SYSTANE FREE OP  Apply 1 drop to eye 3 (three) times daily.     UTI-STAT Liqd  Take 30 mLs by mouth every morning.        Physical exam  BP 134/58 mmHg  Pulse 88  Temp(Src) 98.3 F (36.8 C) (Oral)  Resp 18  Ht 5\' 1"  (1.549 m)  Wt 137 lb 12.8 oz (62.506 kg)  BMI 26.05 kg/m2  SpO2 97%  Wt Readings from Last 3 Encounters:  07/16/14 137 lb 12.8 oz (62.506 kg)  06/11/14 140 lb (63.504 kg)  08/27/12 152 lb 9.6 oz (69.219 kg)   General- elderly female in no acute distress Head- atraumatic, normocephalic Eyes- no pallor, no icterus, no discharge Neck- no lymphadenopathy Mouth- normal mucus membrane Cardiovascular- normal s1,s2, no murmurs, no leg edema, diminished pedal pulses Respiratory- bilateral clear to auscultation, no wheeze, no rhonchi, no crackles Abdomen- bowel sounds present, soft, non tender, obese Musculoskeletal- able to move all 4 extremities, no contractures, non ambulatory, modified shoe for shortened LLE Neurological- no focal deficit Skin- warm and dry, healed forehead scar Psychiatry- pleasantly confused, oriented to self  Labs 04/10/14 a1c 7.7 03/31/14 na 138, k 4.1, glucose 69, bun 17, cr 1.09, ca 8.9 01/28/14  t.chol 150, hdl 51, ldl 75, tg 118 12/17/13 a1c 6.9, wbc 9.3, hb 11.0, hct 35.4, plt 203, na 140, k 3.7, bun 15, cr 0.7, lft wnl, prealb 23.8, t.chol 144, hdl 37, ldl 62, tg 227, urine microalb/cr ratio 1.4 09/18/13 wbc 9.1, hb 10.6, hct 34.9, plt 210, a1c 7.6, na 140, k 3.9, glu 41, bun 20, cr 1.1  ASSESSMENT/PLAN  Hyperlipidemia ldl at goal on labs from 12/15. Continue lipitor 20 mg daily and tricor, check lipid panel and if ldl at goal decrease lipitor dosing  Dm type 2 a1c 7.7 in 3/16. Continue lantus 35 u in am and 30 u in pm. Monitor cbg. Recheck a1c. Continue benazepril and lipitor  Depression with dementia Stable, continue remeron and klonopin current regimen, no changes made  Hypertension Stable bp, continue benazepril 5 mg daily  Protein calorie malnutrition Monitor her weight, continue medpass, vitamin d supplement and MVI. Continue remeron to help with appetite    Oneal Grout, MD  [facility] [phone] (Monday-Friday 8 am - 5 pm) [phone] (afterhours)

## 2014-07-17 LAB — LIPID PANEL
CHOLESTEROL: 130 mg/dL (ref 0–200)
HDL: 50 mg/dL (ref 35–70)
LDL Cholesterol: 55 mg/dL
Triglycerides: 126 mg/dL (ref 40–160)

## 2014-07-17 LAB — HEMOGLOBIN A1C: HEMOGLOBIN A1C: 8 % — AB (ref 4.0–6.0)

## 2014-07-30 LAB — CBC AND DIFFERENTIAL
HCT: 33 % — AB (ref 36–46)
Hemoglobin: 10.7 g/dL — AB (ref 12.0–16.0)
PLATELETS: 215 10*3/uL (ref 150–399)
WBC: 9 10^3/mL

## 2014-07-30 NOTE — Progress Notes (Signed)
This encounter was created in error - please disregard.

## 2014-07-30 NOTE — Progress Notes (Signed)
Patient ID: Marisa Scott, female   DOB: 06/20/1929, 81 y.o.   MRN: [id number]     [name]  Chief complaint: medical management of chronic illness  Allergies: reviewed. NKDA  Code status: full code  HPI 81 y/o female patient is seen for routine visit. She has been at her baseline with no new concerns from staff. bp reviewed 120-133/ 65-90. Her sacral pressure ulcer has healed. She is on lantus for her dm and cbg in am 101-251 with evening reading between 138- 293 with lowest of 99 and highest of 402. Her behavior has been stable. No falls reported. She is getting nutritional supplement for her protein malnutrition. She has hx of dementia, DM, schizoaffective disorder, HTN among others.    ROS Unable to obtain. Limited with her dementia On wheelchair No falls reported  Past medical history reviewed  Medication reviewed. See [facility]   Medication List       This list is accurate as of: 06/11/14 11:59 PM.  Always use your most recent med list.               acetaminophen 325 MG tablet  Commonly known as:  TYLENOL  Take 650 mg by mouth every 4 (four) hours as needed for mild pain.     atorvastatin 20 MG tablet  Commonly known as:  LIPITOR  Take by mouth daily. Take 1 tablet by mouth every evening for hyperlipidemia     benazepril 5 MG tablet  Commonly known as:  LOTENSIN  Take by mouth daily. Take 1 tablet by mouth daily     cholecalciferol 1000 UNITS tablet  Commonly known as:  VITAMIN D  Take 1,000 Units by mouth daily. For Senile Osteoporosis     clonazePAM 0.5 MG tablet  Commonly known as:  KLONOPIN  Take 1/2 tablet by mouth twice daily for mood     DECUBI-VITE PO  Take by mouth. 1 by mouth daily for step II Ulcer     donepezil 10 MG tablet  Commonly known as:  ARICEPT  Take 10 mg by mouth at bedtime.     fenofibrate 48 MG tablet  Commonly known as:  TRICOR  Take by mouth daily. Take 1 tablet every evening  for hyperlipidemia     insulin glargine 100 UNIT/ML injection  Commonly known as:  LANTUS  Inject 30-35 Units into the skin. 35 units in the morning and 30 units in the evening     INVEGA SUSTENNA 117 MG/0.75ML Susp  Generic drug:  Paliperidone Palmitate  Inject 0.75 ml= 117 mg intramuscularly once a month on the 4th for schizaffective disorder     Lavender Oil Oil  by Does not apply route. Apply 1 drop behind each ear every night at bedtime as needed for relaxation     memantine 28 MG Cp24 24 hr capsule  Commonly known as:  NAMENDA XR  Take by mouth. Take 1 tablet by mouth daily for dementia * do not crush or chew     mirtazapine 30 MG disintegrating tablet  Commonly known as:  REMERON SOL-TAB  Take 30 mg by mouth at bedtime.     Oyster Shell Calcium 500 MG Tabs  Take by mouth. Take 1 tablet by mouth twice daily for supplement     primidone 50 MG tablet  Commonly known as:  MYSOLINE  Take 50 mg by mouth 3 (three) times daily.     SYSTANE FREE OP  Apply  1 drop to eye 3 (three) times daily.     UTI-STAT Liqd  Take 30 mLs by mouth every morning.        Physical exam BP 123/81 mmHg  Pulse 88  Temp(Src) 97.8 F (36.6 C)  Resp 18  Ht  (1.549 m)  Wt 140 lb (63.504 kg)  BMI 26.47 kg/m2  SpO2 98%  General- elderly female in no acute distress Head- atraumatic, normocephalic Eyes- no pallor, no icterus, no discharge Neck- no lymphadenopathy Mouth- normal mucus membrane Cardiovascular- normal s1,s2, no murmurs, no leg edema, diminished pedal pulses Respiratory- bilateral clear to auscultation, no wheeze, no rhonchi, no crackles Abdomen- bowel sounds present, soft, non tender Musculoskeletal- able to move all 4 extremities, no contractures Neurological- no focal deficit Skin- warm and dry Psychiatry- pleasantly confused, oriented to self  Labs 04/10/14 a1c 7.7 03/31/14 na 138, k 4.1, glucose 69, bun 17, cr 1.09, ca 8.9 01/28/14 t.chol 150, hdl 51, ldl 75, tg  118 12/17/13 a1c 6.9, wbc 9.3, hb 11.0, hct 35.4, plt 203, na 140, k 3.7, bun 15, cr 0.7, lft wnl, prealb 23.8, t.chol 144, hdl 37, ldl 62, tg 227, urine microalb/cr ratio 1.4 09/18/13 wbc 9.1, hb 10.6, hct 34.9, plt 210, a1c 7.6, na 140, k 3.9, glu 41, bun 20, cr 1.1  ASSESSMENT/PLAN  Diabetes mellitus Monitor cbg, currently on lantus 35 u am nd 30 u pm, continue this regimen  Sacral ulcer stage 2 Healed, continue skin care with pressure ulcer prophylaxis  HLD Continue lipitor 20 mg daily for now with tricor  Essential tremor Stable on current regimen of mysoline, monitor  Oneal Grout, MD  [facility] [phone] (Monday-Friday 8 am - 5 pm) [phone] (afterhours)

## 2014-08-20 ENCOUNTER — Encounter: Payer: Self-pay | Admitting: Internal Medicine

## 2014-08-20 ENCOUNTER — Non-Acute Institutional Stay (SKILLED_NURSING_FACILITY): Payer: Medicare Other | Admitting: Internal Medicine

## 2014-08-20 DIAGNOSIS — E1129 Type 2 diabetes mellitus with other diabetic kidney complication: Secondary | ICD-10-CM

## 2014-08-20 DIAGNOSIS — M818 Other osteoporosis without current pathological fracture: Secondary | ICD-10-CM | POA: Diagnosis not present

## 2014-08-20 DIAGNOSIS — M810 Age-related osteoporosis without current pathological fracture: Secondary | ICD-10-CM | POA: Insufficient documentation

## 2014-08-20 DIAGNOSIS — I10 Essential (primary) hypertension: Secondary | ICD-10-CM | POA: Diagnosis not present

## 2014-08-20 DIAGNOSIS — E785 Hyperlipidemia, unspecified: Secondary | ICD-10-CM | POA: Diagnosis not present

## 2014-08-20 DIAGNOSIS — E1165 Type 2 diabetes mellitus with hyperglycemia: Secondary | ICD-10-CM

## 2014-08-20 DIAGNOSIS — IMO0002 Reserved for concepts with insufficient information to code with codable children: Secondary | ICD-10-CM

## 2014-08-20 NOTE — Progress Notes (Signed)
Patient ID: Marisa Scott, female   DOB: [birth year], 81 y.o.   MRN: [id number]    [facility]   Code Status:  Full Code   No Known Allergies   Chief Complaint  Patient presents with  . Medical Management of Chronic Issues    Routine Visit     HPI 81 y/o female patient is seen for routine visit. She has been at her baseline with no new concerns from staff. No new behavior concerns. No falls.She has hx of dementia, DM, schizoaffective disorder, HTN among others.    ROS Unable to obtain. Limited with her dementia  Past medical history reviewed  Medication reviewed. See [facility]   Medication List       This list is accurate as of: 08/20/14  4:28 PM.  Always use your most recent med list.               acetaminophen 325 MG tablet  Commonly known as:  TYLENOL  Take 650 mg by mouth every 4 (four) hours as needed for mild pain.     AMBULATORY NON FORMULARY MEDICATION  Medication Name: Med Pass 90 mL twice daily for nutritional supplement     atorvastatin 20 MG tablet  Commonly known as:  LIPITOR  Take by mouth daily. Take 1 tablet by mouth every evening for hyperlipidemia     benazepril 5 MG tablet  Commonly known as:  LOTENSIN  Take by mouth daily. Take 1 tablet by mouth daily     cholecalciferol 1000 UNITS tablet  Commonly known as:  VITAMIN D  Take 1,000 Units by mouth daily. For Senile Osteoporosis     clonazePAM 0.5 MG tablet  Commonly known as:  KLONOPIN  Take 1/2 tablet by mouth twice daily for mood     DECUBI-VITE PO  Take by mouth. 1 by mouth daily for step II Ulcer     donepezil 10 MG tablet  Commonly known as:  ARICEPT  Take 10 mg by mouth at bedtime.     fenofibrate 48 MG tablet  Commonly known as:  TRICOR  Take by mouth daily. Take 1 tablet every evening for hyperlipidemia     insulin glargine 100 UNIT/ML injection  Commonly known as:  LANTUS  Inject 30-35 Units into the skin. 35 units in the morning and 30 units in the  evening     INVEGA SUSTENNA 117 MG/0.75ML Susp  Generic drug:  Paliperidone Palmitate  Inject 0.75 ml= 117 mg intramuscularly once a month on the 4th for schizaffective disorder     Lavender Oil Oil  by Does not apply route. Apply 1 drop behind each ear every night at bedtime as needed for relaxation     memantine 28 MG Cp24 24 hr capsule  Commonly known as:  NAMENDA XR  Take by mouth. Take 1 tablet by mouth daily for dementia * do not crush or chew     mirtazapine 30 MG disintegrating tablet  Commonly known as:  REMERON SOL-TAB  Take 30 mg by mouth at bedtime.     OYST-CAL 500 MG Tabs  Generic drug:  Oyster Shell Calcium  Take by mouth daily.     primidone 50 MG tablet  Commonly known as:  MYSOLINE  Take 50 mg by mouth 3 (three) times daily.     SYSTANE FREE OP  Apply 1 drop to eye 3 (three) times daily.     UTI-STAT Liqd  Take 30 mLs by  mouth every morning.        Physical exam BP 131/78 mmHg  Pulse 79  Temp(Src) 98.8 F (37.1 C) (Oral)  Resp 18  SpO2 97%  Wt Readings from Last 3 Encounters:  07/16/14 137 lb 12.8 oz (62.506 kg)  06/11/14 140 lb (63.504 kg)  06/11/14 140 lb (63.504 kg)   General- elderly female in no acute distress Head- atraumatic, normocephalic Eyes- no pallor, no icterus, no discharge Neck- no lymphadenopathy Mouth- normal mucus membrane Cardiovascular- normal s1,s2, no murmurs, no leg edema, diminished pedal pulses Respiratory- bilateral clear to auscultation, no wheeze, no rhonchi, no crackles Abdomen- bowel sounds present, soft, non tender, obese Musculoskeletal- able to move all 4 extremities, no contractures, non ambulatory, modified shoe for shortened LLE Neurological- no focal deficit Skin- warm and dry, healed forehead scar Psychiatry- pleasantly confused, oriented to self  Labs 07/30/14 iron 45, tibc265, b12 794, folate > 20, hb 10.7, hct 33.1, wbc 9, plt 215, ferritin 47 07/17/14 t.chol 130, ldl 55, hdl 50, tg 126, a1c  8 04/10/14 a1c 7.7 03/31/14 na 138, k 4.1, glucose 69, bun 17, cr 1.09, ca 8.9 01/28/14 t.chol 150, hdl 51, ldl 75, tg 118 12/17/13 a1c 6.9, wbc 9.3, hb 11.0, hct 35.4, plt 203, na 140, k 3.7, bun 15, cr 0.7, lft wnl, prealb 23.8, t.chol 144, hdl 37, ldl 62, tg 227, urine microalb/cr ratio 1.4 09/18/13 wbc 9.1, hb 10.6, hct 34.9, plt 210, a1c 7.6, na 140, k 3.9, glu 41, bun 20, cr 1.1  ASSESSMENT/PLAN  Dm type 2 a1c 8 on review. cbg 200-359 with some in 100s. Increase lantus to 35 u bid for now and monitor cbg. Continue benazepril and lipitor  HTN Stable bp reading, continue benazepril 5 mg daily  Hyperlipidemia ldl at goal onrecent lab. Decrease lipitor to 10 mg daily and monitor  Age related osteoporosis Stable, no falls, continue oscal 500 mg bidand add vitamin d 1000 u daily   Oneal GroutMAHIMA Newel Oien, MD  [facility] [phone] (Monday-Friday 8 am - 5 pm) [phone] (afterhours)

## 2014-09-19 NOTE — Progress Notes (Signed)
This encounter was created in error - please disregard.

## 2014-09-29 ENCOUNTER — Non-Acute Institutional Stay (SKILLED_NURSING_FACILITY): Payer: Medicare Other | Admitting: Internal Medicine

## 2014-09-29 DIAGNOSIS — B9620 Unspecified Escherichia coli [E. coli] as the cause of diseases classified elsewhere: Secondary | ICD-10-CM | POA: Insufficient documentation

## 2014-09-29 DIAGNOSIS — G250 Essential tremor: Secondary | ICD-10-CM

## 2014-09-29 DIAGNOSIS — H04129 Dry eye syndrome of unspecified lacrimal gland: Secondary | ICD-10-CM | POA: Insufficient documentation

## 2014-09-29 DIAGNOSIS — H04123 Dry eye syndrome of bilateral lacrimal glands: Secondary | ICD-10-CM | POA: Diagnosis not present

## 2014-09-29 DIAGNOSIS — N390 Urinary tract infection, site not specified: Secondary | ICD-10-CM | POA: Diagnosis not present

## 2014-09-29 DIAGNOSIS — E782 Mixed hyperlipidemia: Secondary | ICD-10-CM | POA: Diagnosis not present

## 2014-09-29 NOTE — Progress Notes (Signed)
Patient ID: Marisa Scott, female   DOB: 08/18/1929, 81 y.o.   MRN: [id number]     [facility]   Code Status:  Full Code   No Known Allergies   Chief Complaint  Patient presents with  . New Admit To SNF    HPI 81 y/o female patient is seen for routine visit. She has completed treatment for e.coli UTI with vantin. systane eye drops have been helpful. Mysoline has helped with her tremors. She has been at her baseline with no new concerns from staff. No new behavior concerns. No falls.She has hx of dementia  ROS Unable to obtain. Limited with her dementia  Past medical history reviewed  Medication reviewed. See [facility]   Medication List       This list is accurate as of: 09/29/14  8:10 PM.  Always use your most recent med list.               acetaminophen 325 MG tablet  Commonly known as:  TYLENOL  Take 650 mg by mouth every 4 (four) hours as needed for mild pain.     AMBULATORY NON FORMULARY MEDICATION  Medication Name: Med Pass 90 mL twice daily for nutritional supplement     atorvastatin 20 MG tablet  Commonly known as:  LIPITOR  Take by mouth daily. Take 1 tablet by mouth every evening for hyperlipidemia     benazepril 5 MG tablet  Commonly known as:  LOTENSIN  Take by mouth daily. Take 1 tablet by mouth daily     cholecalciferol 1000 UNITS tablet  Commonly known as:  VITAMIN D  Take 1,000 Units by mouth daily. For Senile Osteoporosis     clonazePAM 0.5 MG tablet  Commonly known as:  KLONOPIN  Take 1/2 tablet by mouth twice daily for mood     DECUBI-VITE PO  Take by mouth. 1 by mouth daily for step II Ulcer     donepezil 10 MG tablet  Commonly known as:  ARICEPT  Take 10 mg by mouth at bedtime.     fenofibrate 48 MG tablet  Commonly known as:  TRICOR  Take by mouth daily. Take 1 tablet every evening for hyperlipidemia     insulin glargine 100 UNIT/ML injection  Commonly known as:  LANTUS  Inject 30-35 Units into the skin.  35 units in the morning and 30 units in the evening     Lavender Oil Oil  by Does not apply route. Apply 1 drop behind each ear every night at bedtime as needed for relaxation     memantine 28 MG Cp24 24 hr capsule  Commonly known as:  NAMENDA XR  Take by mouth. Take 1 tablet by mouth daily for dementia * do not crush or chew     mirtazapine 30 MG disintegrating tablet  Commonly known as:  REMERON SOL-TAB  Take 30 mg by mouth at bedtime.     OYST-CAL 500 MG Tabs  Generic drug:  Oyster Shell Calcium  Take by mouth daily.     primidone 50 MG tablet  Commonly known as:  MYSOLINE  Take 50 mg by mouth 3 (three) times daily.     SYSTANE FREE OP  Apply 1 drop to eye 3 (three) times daily.     UTI-STAT Liqd  Take 30 mLs by mouth every morning.        Physical exam BP 116/60 mmHg  Pulse 77  Temp(Src) 97 F (36.1 C)  Resp 18  Ht  (1.549 m)  Wt 135 lb 9.6 oz (61.508 kg)  BMI 25.63 kg/m2  SpO2 97%  Wt Readings from Last 3 Encounters:  09/29/14 135 lb 9.6 oz (61.508 kg)  07/16/14 137 lb 12.8 oz (62.506 kg)  06/11/14 140 lb (63.504 kg)   General- elderly female, obese, in no acute distress Head- atraumatic, normocephalic Eyes- no pallor, no icterus, no discharge Neck- no lymphadenopathy Mouth- normal mucus membrane Cardiovascular- normal s1,s2, systolic murmur +, + leg edema, diminished pedal pulses Respiratory- bilateral clear to auscultation, no wheeze, no rhonchi, no crackles Abdomen- bowel sounds present, soft, non tender, obese Musculoskeletal- able to move all 4 extremities, no contractures, non ambulatory, modified shoe for shortened LLE Neurological- no focal deficit, incoherent speech Skin- warm and dry, healed forehead scar Psychiatry- pleasantly confused, oriented to self  Labs 07/30/14 iron 45, tibc265, b12 794, folate > 20, hb 10.7, hct 33.1, wbc 9, plt 215, ferritin 47 07/17/14 t.chol 130, ldl 55, hdl 50, tg 126, a1c 8 04/10/14 a1c 7.7 03/31/14 na 138,  k 4.1, glucose 69, bun 17, cr 1.09, ca 8.9 01/28/14 t.chol 150, hdl 51, ldl 75, tg 118 12/17/13 a1c 6.9, wbc 9.3, hb 11.0, hct 35.4, plt 203, na 140, k 3.7, bun 15, cr 0.7, lft wnl, prealb 23.8, t.chol 144, hdl 37, ldl 62, tg 227, urine microalb/cr ratio 1.4 09/18/13 wbc 9.1, hb 10.6, hct 34.9, plt 210, a1c 7.6, na 140, k 3.9, glu 41, bun 20, cr 1.1  ASSESSMENT/PLAN  Dry eye syndrome Chronic, controlled with systane balance drops, continue this  E.coli UTI Completed antibiotic treatment, asymptomatic, monitor clinically  Essential tremor Continue mysoline 50 mg tid and monitor  Hyperlipidemia ldl at goal. Continue lipitor 10 mg daily with tricor and monitor   Oneal Grout, MD  [facility] [phone] (Monday-Friday 8 am - 5 pm) [phone] (afterhours)

## 2014-10-29 ENCOUNTER — Non-Acute Institutional Stay (SKILLED_NURSING_FACILITY): Payer: Medicare Other | Admitting: Internal Medicine

## 2014-10-29 DIAGNOSIS — F0393 Unspecified dementia, unspecified severity, with mood disturbance: Secondary | ICD-10-CM

## 2014-10-29 DIAGNOSIS — F329 Major depressive disorder, single episode, unspecified: Secondary | ICD-10-CM

## 2014-10-29 DIAGNOSIS — F0280 Dementia in other diseases classified elsewhere without behavioral disturbance: Secondary | ICD-10-CM | POA: Diagnosis not present

## 2014-10-29 DIAGNOSIS — E782 Mixed hyperlipidemia: Secondary | ICD-10-CM | POA: Diagnosis not present

## 2014-10-29 DIAGNOSIS — I10 Essential (primary) hypertension: Secondary | ICD-10-CM

## 2014-10-29 NOTE — Progress Notes (Signed)
Patient ID: Marisa Scott, female   DOB: 1929-04-03, 81 y.o.   MRN: [id number]      [facility]   Code Status:  Full Code   No Known Allergies   Chief Complaint  Patient presents with  . Medical Management of Chronic Issues    Medical Management of Chronic Issues  . OTHER    [facility]    HPI 81 y/o female patient is seen for routine visit. She has been at her baseline with no new concerns from staff. No new behavior concerns. No falls.She has hx of dementia  ROS Unable to obtain. Limited with her dementia  Past medical history reviewed  Medication reviewed. See [facility]   Medication List       This list is accurate as of: 10/29/14 11:32 AM.  Always use your most recent med list.               acetaminophen 325 MG tablet  Commonly known as:  TYLENOL  Take 650 mg by mouth every 4 (four) hours as needed for mild pain.     AMBULATORY NON FORMULARY MEDICATION  Medication Name: Med Pass 90 mL twice daily for nutritional supplement     atorvastatin 20 MG tablet  Commonly known as:  LIPITOR  Take 1 tablet by mouth every evening for hyperlipidemia     benazepril 5 MG tablet  Commonly known as:  LOTENSIN  Take 1 tablet by mouth daily     cholecalciferol 1000 UNITS tablet  Commonly known as:  VITAMIN D  Take 1,000 Units by mouth daily. For Senile Osteoporosis     clonazePAM 0.5 MG tablet  Commonly known as:  KLONOPIN  Take 1/2 tablet by mouth twice daily for mood     DECUBI-VITE PO  Take by mouth. 1 by mouth daily for step II Ulcer     donepezil 10 MG tablet  Commonly known as:  ARICEPT  Take 10 mg by mouth at bedtime.     insulin glargine 100 UNIT/ML injection  Commonly known as:  LANTUS  Inject 35 units subcutaneously every morning and inject 35 units subcutaneously every evening to control blood sugar     INVEGA SUSTENNA 117 MG/0.75ML Susp  Generic drug:  Paliperidone Palmitate  Inject 0.48ml intramuscularly once a month on the 4th  for schizaffective disorder     Lavender Oil Oil  by Does not apply route. Apply 1 drop behind each ear every night at bedtime as needed for relaxation     memantine 28 MG Cp24 24 hr capsule  Commonly known as:  NAMENDA XR  Take 1 tablet by mouth daily for dementia * do not crush or chew     mirtazapine 30 MG disintegrating tablet  Commonly known as:  REMERON SOL-TAB  Take 30 mg by mouth at bedtime.     OYST-CAL 500 MG Tabs  Generic drug:  Oyster Shell Calcium  Take by mouth daily.     primidone 50 MG tablet  Commonly known as:  MYSOLINE  Take 50 mg by mouth 3 (three) times daily.     SYSTANE FREE OP  Apply 1 drop to eye 3 (three) times daily.     UTI-STAT Liqd  Take 30 mLs by mouth every morning.        Physical exam BP 139/66 mmHg  Pulse 74  Temp(Src) 98.6 F (37 C) (Oral)  Resp 18  Ht  (1.549 m)  Wt 135 lb  14.4 oz (61.644 kg)  BMI 25.69 kg/m2  SpO2 98%  Wt Readings from Last 3 Encounters:  10/29/14 135 lb 14.4 oz (61.644 kg)  09/29/14 135 lb 9.6 oz (61.508 kg)  07/16/14 137 lb 12.8 oz (62.506 kg)   General- elderly female, obese, in no acute distress Head- atraumatic, normocephalic Eyes- no pallor, no icterus, no discharge Neck- no lymphadenopathy Mouth- normal mucus membrane Cardiovascular- normal s1,s2, systolic murmur +, + leg edema, diminished pedal pulses Respiratory- bilateral clear to auscultation, no wheeze, no rhonchi, no crackles Abdomen- bowel sounds present, soft, non tender, obese Musculoskeletal- able to move all 4 extremities, no contractures, non ambulatory, modified shoe for shortened LLE Neurological- no focal deficit, incoherent speech Skin- warm and dry, healed forehead scar Psychiatry- pleasantly confused, oriented to self  Labs 07/30/14 iron 45, tibc265, b12 794, folate > 20, hb 10.7, hct 33.1, wbc 9, plt 215, ferritin 47 07/17/14 t.chol 130, ldl 55, hdl 50, tg 126, a1c 8 04/10/14 a1c 7.7 03/31/14 na 138, k 4.1, glucose 69, bun  17, cr 1.09, ca 8.9 01/28/14 t.chol 150, hdl 51, ldl 75, tg 118 12/17/13 a1c 6.9, wbc 9.3, hb 11.0, hct 35.4, plt 203, na 140, k 3.7, bun 15, cr 0.7, lft wnl, prealb 23.8, t.chol 144, hdl 37, ldl 62, tg 227, urine microalb/cr ratio 1.4 09/18/13 wbc 9.1, hb 10.6, hct 34.9, plt 210, a1c 7.6, na 140, k 3.9, glu 41, bun 20, cr 1.1  ASSESSMENT/PLAN  HTN Stable bp reading, continue benazepril 5 mg daily and monitor bp  Hyperlipidemia Continue lipitor 10 mg daily and monitor  Depression with dementia Continue remeron 30 mg daily with her dementia medication namenda and aricept for now  Oneal Grout, MD  [facility] [phone] (Monday-Friday 8 am - 5 pm) [phone] (afterhours)

## 2014-11-24 ENCOUNTER — Encounter: Payer: Self-pay | Admitting: Internal Medicine

## 2014-11-24 ENCOUNTER — Non-Acute Institutional Stay (SKILLED_NURSING_FACILITY): Payer: Medicare Other | Admitting: Internal Medicine

## 2014-11-24 DIAGNOSIS — M818 Other osteoporosis without current pathological fracture: Secondary | ICD-10-CM | POA: Diagnosis not present

## 2014-11-24 DIAGNOSIS — N183 Chronic kidney disease, stage 3 unspecified: Secondary | ICD-10-CM | POA: Insufficient documentation

## 2014-11-24 DIAGNOSIS — R809 Proteinuria, unspecified: Secondary | ICD-10-CM

## 2014-11-24 DIAGNOSIS — E1122 Type 2 diabetes mellitus with diabetic chronic kidney disease: Secondary | ICD-10-CM

## 2014-11-24 DIAGNOSIS — I10 Essential (primary) hypertension: Secondary | ICD-10-CM

## 2014-11-24 DIAGNOSIS — Z794 Long term (current) use of insulin: Secondary | ICD-10-CM

## 2014-11-24 DIAGNOSIS — E1165 Type 2 diabetes mellitus with hyperglycemia: Secondary | ICD-10-CM | POA: Diagnosis not present

## 2014-11-24 DIAGNOSIS — M810 Age-related osteoporosis without current pathological fracture: Secondary | ICD-10-CM

## 2014-11-24 DIAGNOSIS — IMO0002 Reserved for concepts with insufficient information to code with codable children: Secondary | ICD-10-CM

## 2014-11-24 NOTE — Progress Notes (Signed)
Patient ID: Marisa Scott, female   DOB: 10-Oct-1929, 81 y.o.   MRN: [id number]      [facility]   Code Status:  Full Code   No Known Allergies   Chief Complaint  Patient presents with  . Medical Management of Chronic Issues    Routine Visit     HPI 81 y/o female patient is seen for routine visit. Unable to obtain HPI or ROS with her advanced dementia. She has been at her baseline with no new concerns from staff. No new behavior concerns. No falls. No new skin concern. Feeds herself. Received flu vaccine 11/07/14. Her lantus evening dosing was decreased to 28 u on 11/18/14. No hypoglycemia reported. Has now been off tricor.  ROS Unable to obtain. Limited with her dementia  Past medical history reviewed  Medication reviewed. See [facility]   Medication List       This list is accurate as of: 11/24/14 12:48 PM.  Always use your most recent med list.               acetaminophen 325 MG tablet  Commonly known as:  TYLENOL  Take 650 mg by mouth every 4 (four) hours as needed for mild pain.     AMBULATORY NON FORMULARY MEDICATION  Medication Name: Med Pass 90 mL twice daily for nutritional supplement     atorvastatin 10 MG tablet  Commonly known as:  LIPITOR  Take 10 mg by mouth daily.     benazepril 5 MG tablet  Commonly known as:  LOTENSIN  Take 1 tablet by mouth daily     cholecalciferol 1000 UNITS tablet  Commonly known as:  VITAMIN D  Take 1,000 Units by mouth daily. For Senile Osteoporosis     clonazePAM 0.5 MG tablet  Commonly known as:  KLONOPIN  Take 1/2 tablet by mouth twice daily for mood     DECUBI-VITE PO  Take by mouth. 1 by mouth daily for step II Ulcer     donepezil 10 MG tablet  Commonly known as:  ARICEPT  Take 10 mg by mouth at bedtime.     insulin glargine 100 UNIT/ML injection  Commonly known as:  LANTUS  Inject 35 units subcutaneously every morning and inject 28 units subcutaneously every evening to control blood  sugar     INVEGA SUSTENNA 117 MG/0.75ML Susp  Generic drug:  Paliperidone Palmitate  Inject 0.67ml intramuscularly once a month on the 4th for schizaffective disorder     Lavender Oil Oil  by Does not apply route. Apply 1 drop behind each ear every night at bedtime as needed for relaxation     memantine 28 MG Cp24 24 hr capsule  Commonly known as:  NAMENDA XR  Take 1 tablet by mouth daily for dementia * do not crush or chew     mirtazapine 30 MG disintegrating tablet  Commonly known as:  REMERON SOL-TAB  Take 30 mg by mouth at bedtime.     OYST-CAL 500 MG Tabs  Generic drug:  Oyster Shell Calcium  Take by mouth daily.     primidone 50 MG tablet  Commonly known as:  MYSOLINE  Take 50 mg by mouth 3 (three) times daily.     SYSTANE FREE OP  Apply 1 drop to eye 3 (three) times daily.     UTI-STAT Liqd  Take 30 mLs by mouth every morning.        Physical exam BP 155/69 mmHg  Pulse 76  Temp(Src) 97.6 F (36.4 C) (Oral)  Resp 18  Wt 138 lb (62.596 kg)  SpO2 99%  Wt Readings from Last 3 Encounters:  11/24/14 138 lb (62.596 kg)  10/29/14 135 lb 14.4 oz (61.644 kg)  09/29/14 135 lb 9.6 oz (61.508 kg)   General- elderly female, obese, in no acute distress Head- atraumatic, normocephalic Eyes- no pallor, no icterus, no discharge Neck- no lymphadenopathy Mouth- normal mucus membrane Cardiovascular- normal s1,s2, systolic murmur +, + leg edema, diminished pedal pulses Respiratory- bilateral clear to auscultation, no wheeze, no rhonchi, no crackles Abdomen- bowel sounds present, soft, non tender, obese Musculoskeletal- able to move all 4 extremities, no contractures, non ambulatory, modified shoe for shortened LLE Neurological- no focal deficit, incoherent speech Skin- warm and dry Psychiatry- pleasantly confused, oriented to self  Labs CBC Latest Ref Rng 07/30/2014 12/17/2013 07/11/2012  WBC - 9.0 9.3 12.0(H)  Hemoglobin 12.0 - 16.0 g/dL 10.7(A) - 12.3  Hematocrit 36  - 46 % 33(A) - 37.6  Platelets 150 - 399 K/L 215 - 220.0   CMP Latest Ref Rng 03/31/2014 07/11/2012 09/22/2008  Glucose 70 - 99 mg/dL - - 478(G254(H)  BUN 4 - 21 mg/dL 17 - 8  Creatinine 0.4 - 1.2 mg/dL - - 9.561.01  Sodium 213137 - 147 mmol/L 138 - 138  Potassium 3.4 - 5.3 mmol/L 4.1 - 4.6  Chloride 96 - 112 mEq/L - - 112  CO2 19 - 32 mEq/L - - 20  Calcium 8.4 - 10.5 mg/dL - - 8.3(L)  Total Protein 6.0 - 8.3 g/dL - 6.4 -  Total Bilirubin 0.3 - 1.2 mg/dL - 0.4 -  Alkaline Phos 39 - 117 U/L - 97 -  AST 0 - 37 U/L - 20 -  ALT 0 - 35 U/L - 21 -   09/05/14 na 142, k 4, bun 28, cr 1.09, alb 2.9, ca 8, normal amylase and lipase 08/25/14 microalbuminuria present 07/30/14 iron 45, tibc265, b12 794, folate > 20, hb 10.7, hct 33.1, wbc 9, plt 215, ferritin 47 07/17/14 t.chol 130, ldl 55, hdl 50, tg 126, a1c 8 04/10/14 a1c 7.7 03/31/14 na 138, k 4.1, glucose 69, bun 17, cr 1.09, ca 8.9 01/28/14 t.chol 150, hdl 51, ldl 75, tg 118 12/17/13 a1c 6.9, wbc 9.3, hb 11.0, hct 35.4, plt 203, na 140, k 3.7, bun 15, cr 0.7, lft wnl, prealb 23.8, t.chol 144, hdl 37, ldl 62, tg 227, urine microalb/cr ratio 1.4 09/18/13 wbc 9.1, hb 10.6, hct 34.9, plt 210, a1c 7.6, na 140, k 3.9, glu 41, bun 20, cr 1.1  ASSESSMENT/PLAN  Diabetes mellitus with renal impairment No recent a1c for review. Check a1c. Continue lantus 35 u am and 28 u pm for now  Microalbuminuria with diabetes Noted to be positive for microalbuminuria. Currently on benazepril 5 mg daily for renal protection  ckd stage 3 With dm and HTN, monitor renal function, avoid nephrotoxic agent. Check cmp  HTN Elevated SBP. Continue benazepril 5 mg daily and monitor bp bid x 2 weeks and adjsut bp medication if bp > 140/90  Disuse osteoporosis Limited mobility on wheelchair. Continue calcium and vitamin d supplement   Oneal GroutMAHIMA Tiphani Mells, MD  [facility] [phone] (Monday-Friday 8 am - 5 pm) [phone] (afterhours)

## 2014-11-25 LAB — BASIC METABOLIC PANEL
BUN: 11 mg/dL (ref 4–21)
Creatinine: 1 mg/dL (ref 0.5–1.1)
Glucose: 74 mg/dL
Potassium: 4.4 mmol/L (ref 3.4–5.3)
SODIUM: 141 mmol/L (ref 137–147)

## 2014-11-25 LAB — HEPATIC FUNCTION PANEL
ALT: 14 U/L (ref 7–35)
AST: 18 U/L (ref 13–35)
Alkaline Phosphatase: 113 U/L (ref 25–125)
BILIRUBIN, TOTAL: 0.3 mg/dL

## 2014-11-25 LAB — HEMOGLOBIN A1C: Hgb A1c MFr Bld: 6.9 % — AB (ref 4.0–6.0)

## 2015-01-05 ENCOUNTER — Non-Acute Institutional Stay (SKILLED_NURSING_FACILITY): Payer: Medicare Other | Admitting: Internal Medicine

## 2015-01-05 ENCOUNTER — Encounter: Payer: Self-pay | Admitting: Internal Medicine

## 2015-01-05 DIAGNOSIS — R251 Tremor, unspecified: Secondary | ICD-10-CM

## 2015-01-05 DIAGNOSIS — N183 Chronic kidney disease, stage 3 unspecified: Secondary | ICD-10-CM

## 2015-01-05 DIAGNOSIS — G250 Essential tremor: Secondary | ICD-10-CM

## 2015-01-05 DIAGNOSIS — H04123 Dry eye syndrome of bilateral lacrimal glands: Secondary | ICD-10-CM

## 2015-01-05 NOTE — Progress Notes (Signed)
Patient ID: Chiquita LothDorothy J Mohabir, female   DOB: 06/20/1929, 81 y.o.   MRN: [id number]      [facility]   Code Status:  Full Code   No Known Allergies   Chief Complaint  Patient presents with  . Medical Management of Chronic Issues    Routine Visit     HPI 81 y/o female patient is seen for routine visit. She has been at her baseline with no new concerns from staff. No new behavior concerns. No falls. No new skin concern.   ROS Unable to obtain. Limited with her dementia  Past medical history reviewed  [facility]   Medication List       This list is accurate as of: 01/05/15 12:55 PM.  Always use your most recent med list.               acetaminophen 325 MG tablet  Commonly known as:  TYLENOL  Take 650 mg by mouth every 4 (four) hours as needed for mild pain.     AMBULATORY NON FORMULARY MEDICATION  Medication Name: Med Pass 90 mL twice daily for nutritional supplement     atorvastatin 10 MG tablet  Commonly known as:  LIPITOR  Take 10 mg by mouth daily.     benazepril 5 MG tablet  Commonly known as:  LOTENSIN  Take 1 tablet by mouth daily     cholecalciferol 1000 UNITS tablet  Commonly known as:  VITAMIN D  Take 1,000 Units by mouth daily. For Senile Osteoporosis     clonazePAM 0.5 MG tablet  Commonly known as:  KLONOPIN  Take 1/2 tablet by mouth twice daily for mood     DECUBI-VITE PO  Take by mouth. 1 by mouth daily for step II Ulcer     donepezil 10 MG tablet  Commonly known as:  ARICEPT  Take 10 mg by mouth at bedtime.     insulin glargine 100 UNIT/ML injection  Commonly known as:  LANTUS  Inject 35 units subcutaneously every morning and inject 28 units subcutaneously every evening to control blood sugar     INVEGA SUSTENNA 117 MG/0.75ML Susp  Generic drug:  Paliperidone Palmitate  Inject 0.7075ml intramuscularly once a month on the 4th for schizaffective disorder     Lavender Oil Oil  by Does not apply  route. Apply 1 drop behind each ear every night at bedtime as needed for relaxation     memantine 28 MG Cp24 24 hr capsule  Commonly known as:  NAMENDA XR  Take 1 tablet by mouth daily for dementia * do not crush or chew     mirtazapine 30 MG disintegrating tablet  Commonly known as:  REMERON SOL-TAB  Take 30 mg by mouth at bedtime.     OYST-CAL 500 MG Tabs  Generic drug:  Oyster Shell Calcium  Take by mouth daily.     primidone 50 MG tablet  Commonly known as:  MYSOLINE  Take 50 mg by mouth 3 (three) times daily.     SYSTANE FREE OP  Apply 1 drop to eye 3 (three) times daily.     UNABLE TO FIND  Med Name: Magic Cup daily at 2 pm with Medpass     UTI-STAT Liqd  Take 30 mLs by mouth every morning.        Physical exam BP 130/72 mmHg  Pulse 76  Temp(Src) 97.2 F (36.2 C) (Oral)  Resp 18  SpO2 97%  Wt Readings from Last 3 Encounters:  11/24/14 138 lb (62.596 kg)  10/29/14 135 lb 14.4 oz (61.644 kg)  09/29/14 135 lb 9.6 oz (61.508 kg)   General- elderly female, obese, in no acute distress Head- atraumatic, normocephalic Eyes- no pallor, no icterus, no discharge Neck- no lymphadenopathy Mouth- normal mucus membrane Cardiovascular- normal s1,s2, systolic murmur +, + leg edema, diminished pedal pulses Respiratory- bilateral clear to auscultation, no wheeze, no rhonchi, no crackles Abdomen- bowel sounds present, soft, non tender, obese Musculoskeletal- able to move all 4 extremities, no contractures, non ambulatory, modified shoe for shortened LLE   Labs CBC Latest Ref Rng 07/30/2014 12/17/2013 07/11/2012  WBC - 9.0 9.3 12.0(H)  Hemoglobin 12.0 - 16.0 g/dL 10.7(A) - 12.3  Hematocrit 36 - 46 % 33(A) - 37.6  Platelets 150 - 399 K/L 215 - 220.0   CMP Latest Ref Rng 11/25/2014 03/31/2014 07/11/2012  Glucose 70 - 99 mg/dL - - -  BUN 4 - 21 mg/dL 11 17 -  Creatinine 0.5 - 1.1 mg/dL 1.0 - -  Sodium 811 - 914 mmol/L 141 138 -  Potassium 3.4 - 5.3 mmol/L 4.4 4.1 -    Chloride 96 - 112 mEq/L - - -  CO2 19 - 32 mEq/L - - -  Calcium 8.4 - 10.5 mg/dL - - -  Total Protein 6.0 - 8.3 g/dL - - 6.4  Total Bilirubin 0.3 - 1.2 mg/dL - - 0.4  Alkaline Phos 25 - 125 U/L 113 - 97  AST 13 - 35 U/L 18 - 20  ALT 7 - 35 U/L 14 - 21    ASSESSMENT/PLAN  ckd stage 3 With dm and HTN, monitor renal function, avoid nephrotoxic agent  Dry eye Continue systane eye drop and monitor  Physiological tremor Continue mysoline, no changes made   Oneal Grout, MD  [facility] [phone] (Monday-Friday 8 am - 5 pm) [phone] (afterhours)

## 2015-01-06 LAB — HM DIABETES FOOT EXAM

## 2015-02-06 ENCOUNTER — Non-Acute Institutional Stay (SKILLED_NURSING_FACILITY): Payer: Medicare Other | Admitting: Internal Medicine

## 2015-02-06 DIAGNOSIS — G308 Other Alzheimer's disease: Secondary | ICD-10-CM

## 2015-02-06 DIAGNOSIS — I10 Essential (primary) hypertension: Secondary | ICD-10-CM

## 2015-02-06 DIAGNOSIS — F0281 Dementia in other diseases classified elsewhere with behavioral disturbance: Secondary | ICD-10-CM | POA: Diagnosis not present

## 2015-02-06 DIAGNOSIS — E785 Hyperlipidemia, unspecified: Secondary | ICD-10-CM | POA: Diagnosis not present

## 2015-02-06 DIAGNOSIS — F209 Schizophrenia, unspecified: Secondary | ICD-10-CM

## 2015-02-06 DIAGNOSIS — G309 Alzheimer's disease, unspecified: Secondary | ICD-10-CM

## 2015-02-06 NOTE — Progress Notes (Signed)
Patient ID: Marisa Scott, female   DOB: [birth year], 82 y.o.   MRN: [id number]    [facility]   Code Status:  Full Code   No Known Allergies   Chief Complaint  Patient presents with  . Medical Management of Chronic Issues    HPI 82 y/o female patient is seen for routine visit. She has been at her baseline with no new concerns from staff. No new behavior concerns. No falls. No new skin concern. Feeds herself.  Unable to obtain HPI or ROS with her advanced dementia.   ROS Unable to obtain. Limited with her dementia  Past medical history reviewed  Medication reviewed. See [facility]   Medication List       This list is accurate as of: 02/06/15 11:59 PM.  Always use your most recent med list.               acetaminophen 325 MG tablet  Commonly known as:  TYLENOL  Take 650 mg by mouth every 4 (four) hours as needed for mild pain.     AMBULATORY NON FORMULARY MEDICATION  Medication Name: Med Pass 90 mL twice daily for nutritional supplement     atorvastatin 10 MG tablet  Commonly known as:  LIPITOR  Take 10 mg by mouth daily.     benazepril 5 MG tablet  Commonly known as:  LOTENSIN  Take 1 tablet by mouth daily     cholecalciferol 1000 units tablet  Commonly known as:  VITAMIN D  Take 1,000 Units by mouth daily. For Senile Osteoporosis     clonazePAM 0.5 MG tablet  Commonly known as:  KLONOPIN  Take 1/2 tablet by mouth twice daily for mood     DECUBI-VITE PO  Take by mouth. 1 by mouth daily for step II Ulcer     donepezil 10 MG tablet  Commonly known as:  ARICEPT  Take 10 mg by mouth at bedtime.     insulin glargine 100 UNIT/ML injection  Commonly known as:  LANTUS  Inject 35 units subcutaneously every morning and inject 28 units subcutaneously every evening to control blood sugar     INVEGA SUSTENNA 117 MG/0.75ML Susp  Generic drug:  Paliperidone Palmitate  Inject 0.31ml intramuscularly once a month on the 4th for schizaffective  disorder     Lavender Oil Oil  by Does not apply route. Apply 1 drop behind each ear every night at bedtime as needed for relaxation     memantine 28 MG Cp24 24 hr capsule  Commonly known as:  NAMENDA XR  Take 1 tablet by mouth daily for dementia * do not crush or chew     mirtazapine 30 MG disintegrating tablet  Commonly known as:  REMERON SOL-TAB  Take 30 mg by mouth at bedtime.     OYST-CAL 500 MG Tabs  Generic drug:  Oyster Shell Calcium  Take by mouth daily.     primidone 50 MG tablet  Commonly known as:  MYSOLINE  Take 50 mg by mouth 3 (three) times daily.     SYSTANE FREE OP  Apply 1 drop to eye 3 (three) times daily.     UNABLE TO FIND  Med Name: Magic Cup daily at 2 pm with Medpass     UTI-STAT Liqd  Take 30 mLs by mouth every morning.        Physical exam BP 101/65 mmHg  Pulse 79  Temp(Src) 98.3 F (36.8 C)  Resp  16  SpO2 98%  Wt Readings from Last 3 Encounters:  11/24/14 138 lb (62.596 kg)  10/29/14 135 lb 14.4 oz (61.644 kg)  09/29/14 135 lb 9.6 oz (61.508 kg)   General- elderly female, obese, in no acute distress Head- atraumatic, normocephalic Eyes- no pallor, no icterus, no discharge Neck- no lymphadenopathy Mouth- normal mucus membrane Cardiovascular- normal s1,s2, systolic murmur +, + leg edema, diminished pedal pulses Respiratory- bilateral clear to auscultation, no wheeze, no rhonchi, no crackles Abdomen- bowel sounds present, soft, non tender, obese Musculoskeletal- able to move all 4 extremities, no contractures, non ambulatory, modified shoe for shortened LLE Neurological- no focal deficit, incoherent speech Skin- warm and dry Psychiatry- pleasantly confused, oriented to self  Labs CBC Latest Ref Rng 07/30/2014 12/17/2013 07/11/2012  WBC - 9.0 9.3 12.0(H)  Hemoglobin 12.0 - 16.0 g/dL 10.7(A) - 12.3  Hematocrit 36 - 46 % 33(A) - 37.6  Platelets 150 - 399 K/L 215 - 220.0   CMP Latest Ref Rng 11/25/2014 03/31/2014 07/11/2012  Glucose  70 - 99 mg/dL - - -  BUN 4 - 21 mg/dL 11 17 -  Creatinine 0.5 - 1.1 mg/dL 1.0 - -  Sodium 161137 - 096147 mmol/L 141 138 -  Potassium 3.4 - 5.3 mmol/L 4.4 4.1 -  Chloride 96 - 112 mEq/L - - -  CO2 19 - 32 mEq/L - - -  Calcium 8.4 - 10.5 mg/dL - - -  Total Protein 6.0 - 8.3 g/dL - - 6.4  Total Bilirubin 0.3 - 1.2 mg/dL - - 0.4  Alkaline Phos 25 - 125 U/L 113 - 97  AST 13 - 35 U/L 18 - 20  ALT 7 - 35 U/L 14 - 21   09/05/14 na 142, k 4, bun 28, cr 1.09, alb 2.9, ca 8, normal amylase and lipase 08/25/14 microalbuminuria present 07/30/14 iron 45, tibc265, b12 794, folate > 20, hb 10.7, hct 33.1, wbc 9, plt 215, ferritin 47 07/17/14 t.chol 130, ldl 55, hdl 50, tg 126, a1c 8 04/10/14 a1c 7.7   ASSESSMENT/PLAN  Chronic schizophrenia Stable, invega provided 02/05/15.  Continue klnopin and remeron current regimen  HTN Stable, continue benazepril 5 mg daily, monitor BP and cmp  Advanced dementia Continue her namenda xr and aricept, assistance with ADLs, fall precautions and pressure ulcer prophylaxis  Hyperlipidemia Continue lipitor 10 mg daily, check lipid panel and a1c  Lab ordered - Cbc, cmp, a1c, lipid panel   Oneal GroutMAHIMA Mikenzie Mccannon, MD  [facility] [phone] (Monday-Friday 8 am - 5 pm) [phone] (afterhours)

## 2015-02-09 DIAGNOSIS — F209 Schizophrenia, unspecified: Secondary | ICD-10-CM | POA: Insufficient documentation

## 2015-02-09 LAB — HEPATIC FUNCTION PANEL
ALT: 11 U/L (ref 7–35)
AST: 16 U/L (ref 13–35)
Alkaline Phosphatase: 90 U/L (ref 25–125)
BILIRUBIN, TOTAL: 0.3 mg/dL

## 2015-02-09 LAB — CBC AND DIFFERENTIAL
HCT: 38 % (ref 36–46)
HEMOGLOBIN: 11.6 g/dL — AB (ref 12.0–16.0)
Platelets: 225 10*3/uL (ref 150–399)
WBC: 9.3 10*3/mL

## 2015-02-09 LAB — LIPID PANEL
Cholesterol: 174 mg/dL (ref 0–200)
HDL: 42 mg/dL (ref 35–70)
LDL Cholesterol: 104 mg/dL
TRIGLYCERIDES: 139 mg/dL (ref 40–160)

## 2015-02-09 LAB — HEMOGLOBIN A1C: HEMOGLOBIN A1C: 6.9

## 2015-02-09 LAB — BASIC METABOLIC PANEL
BUN: 16 mg/dL (ref 4–21)
CREATININE: 0.9 mg/dL (ref 0.5–1.1)
GLUCOSE: 40 mg/dL
POTASSIUM: 3.9 mmol/L (ref 3.4–5.3)
SODIUM: 144 mmol/L (ref 137–147)

## 2015-03-09 ENCOUNTER — Non-Acute Institutional Stay (SKILLED_NURSING_FACILITY): Payer: Medicare Other | Admitting: Internal Medicine

## 2015-03-09 ENCOUNTER — Encounter: Payer: Self-pay | Admitting: Internal Medicine

## 2015-03-09 DIAGNOSIS — R809 Proteinuria, unspecified: Secondary | ICD-10-CM

## 2015-03-09 DIAGNOSIS — E1121 Type 2 diabetes mellitus with diabetic nephropathy: Secondary | ICD-10-CM | POA: Diagnosis not present

## 2015-03-09 DIAGNOSIS — F329 Major depressive disorder, single episode, unspecified: Secondary | ICD-10-CM

## 2015-03-09 DIAGNOSIS — E785 Hyperlipidemia, unspecified: Secondary | ICD-10-CM | POA: Diagnosis not present

## 2015-03-09 DIAGNOSIS — N3090 Cystitis, unspecified without hematuria: Secondary | ICD-10-CM | POA: Insufficient documentation

## 2015-03-09 DIAGNOSIS — N3080 Other cystitis without hematuria: Secondary | ICD-10-CM | POA: Diagnosis not present

## 2015-03-09 NOTE — Progress Notes (Signed)
Patient ID: RAIANNA SLIGHT, female   DOB: 1929/10/19, 82 y.o.   MRN: [id number]    [facility]   Code Status:  Full Code   No Known Allergies   Chief Complaint  Patient presents with  . Medical Management of Chronic Issues    Routine Visit    HPI 82 y/o female patient is seen for routine visit. She has been at her baseline with no new concerns from staff. No new behavior concerns. No falls. No new skin concern. Feeds herself.  Limited HPI or ROS with her advanced dementia.   ROS Constitutional: Negative for fever HENT: Negative for congestion.   Eyes: Negative for blurred vision, double vision Respiratory: Negative for cough, shortness of breath and wheezing.   Cardiovascular: Negative for chest pain Gastrointestinal: Negative for heartburn, nausea, vomiting, abdominal pain.  Genitourinary: Negative for dysuria    Past medical history reviewed  Medication reviewed. See [facility]   Medication List       This list is accurate as of: 03/09/15 11:54 AM.  Always use your most recent med list.               acetaminophen 325 MG tablet  Commonly known as:  TYLENOL  Take 650 mg by mouth every 4 (four) hours as needed for mild pain.     AMBULATORY NON FORMULARY MEDICATION  Medication Name: Med Pass 90 mL twice daily for nutritional supplement     atorvastatin 10 MG tablet  Commonly known as:  LIPITOR  Take 10 mg by mouth daily.     benazepril 5 MG tablet  Commonly known as:  LOTENSIN  Take 1 tablet by mouth daily     cholecalciferol 1000 units tablet  Commonly known as:  VITAMIN D  Take 1,000 Units by mouth daily. For Senile Osteoporosis     clonazePAM 0.5 MG tablet  Commonly known as:  KLONOPIN  Take 1/2 tablet by mouth twice daily for mood     DECUBI-VITE PO  Take by mouth. 1 by mouth daily for step II Ulcer     donepezil 10 MG tablet  Commonly known as:  ARICEPT  Take 10 mg by mouth at bedtime.     insulin glargine 100 UNIT/ML  injection  Commonly known as:  LANTUS  Inject 35 units subcutaneously every morning and inject 28 units subcutaneously every evening to control blood sugar     INVEGA SUSTENNA 117 MG/0.75ML Susp  Generic drug:  Paliperidone Palmitate  Inject 0.6ml intramuscularly once a month on the 4th for schizaffective disorder     Lavender Oil Oil  by Does not apply route. Apply 1 drop behind each ear every night at bedtime as needed for relaxation     memantine 28 MG Cp24 24 hr capsule  Commonly known as:  NAMENDA XR  Take 1 tablet by mouth daily for dementia * do not crush or chew     mirtazapine 30 MG disintegrating tablet  Commonly known as:  REMERON SOL-TAB  Take 30 mg by mouth at bedtime.     OYST-CAL 500 MG Tabs  Generic drug:  Oyster Shell Calcium  Take by mouth daily.     primidone 50 MG tablet  Commonly known as:  MYSOLINE  Take 50 mg by mouth 3 (three) times daily.     SYSTANE FREE OP  Apply 1 drop to eye 3 (three) times daily.     UNABLE TO FIND  Med Name: Magic  Cup daily at 2 pm with Medpass     UTI-STAT Liqd  Take 30 mLs by mouth every morning.        Physical exam BP 105/61 mmHg  Pulse 87  Temp(Src) 97.9 F (36.6 C) (Oral)  Resp 16  Ht  (1.549 m)  SpO2 93%  Wt Readings from Last 3 Encounters:  11/24/14 138 lb (62.596 kg)  10/29/14 135 lb 14.4 oz (61.644 kg)  09/29/14 135 lb 9.6 oz (61.508 kg)   General- elderly female, obese, in no acute distress Head- atraumatic, normocephalic Eyes- no pallor, no icterus, no discharge Neck- no lymphadenopathy Mouth- normal mucus membrane, adentulous Cardiovascular- normal s1,s2, systolic murmur +, leg edema +, diminished pedal pulses Respiratory- bilateral clear to auscultation, no wheeze, no rhonchi, no crackles Abdomen- bowel sounds present, soft, non tender, obese Musculoskeletal- able to move all 4 extremities, no contractures, non ambulatory, modified shoe for shortened LLE Neurological- one word answer  sometimes otherwise incoherent speech Skin- warm and dry Psychiatry- pleasantly confused, oriented to self  Labs CBC Latest Ref Rng 07/30/2014 12/17/2013 07/11/2012  WBC - 9.0 9.3 12.0(H)  Hemoglobin 12.0 - 16.0 g/dL 10.7(A) - 12.3  Hematocrit 36 - 46 % 33(A) - 37.6  Platelets 150 - 399 K/L 215 - 220.0   CMP Latest Ref Rng 11/25/2014 03/31/2014 07/11/2012  Glucose 70 - 99 mg/dL - - -  BUN 4 - 21 mg/dL 11 17 -  Creatinine 0.5 - 1.1 mg/dL 1.0 - -  Sodium 161 - 096 mmol/L 141 138 -  Potassium 3.4 - 5.3 mmol/L 4.4 4.1 -  Chloride 96 - 112 mEq/L - - -  CO2 19 - 32 mEq/L - - -  Calcium 8.4 - 10.5 mg/dL - - -  Total Protein 6.0 - 8.3 g/dL - - 6.4  Total Bilirubin 0.3 - 1.2 mg/dL - - 0.4  Alkaline Phos 25 - 125 U/L 113 - 97  AST 13 - 35 U/L 18 - 20  ALT 7 - 35 U/L 14 - 21   Lab Results  Component Value Date   HGBA1C 6.9* 11/25/2014   Lipid Panel     Component Value Date/Time   CHOL 130 07/17/2014   TRIG 126 07/17/2014   HDL 50 07/17/2014   CHOLHDL 4.7 09/19/2008 1845   VLDL 57* 09/19/2008 1845   LDLCALC 55 07/17/2014   08/25/14 microalbuminuria present   ASSESSMENT/PLAN  HLD Will need repeat lipid panel check. Continue lipitor 10 mg daily for now  Recurrent cystitis Continue cranberry and vitamin c supplement, perineal hygiene. Currently asymptomatic  Chronic depression Attempt reduction of remeron to 15 mg daily for now and monitor. Continue klonopin for her anxiety  DM with nephropathy Currently on lantus 35 u am and 28 u pm. Monitor cbg. Check a1c, cmp and lipid panel. Continue lisinopril  Microalbuminuria Positive, continue lisinopril for renal protection    Oneal Grout, MD  [facility] [phone] (Monday-Friday 8 am - 5 pm) [phone] (afterhours)

## 2015-04-03 ENCOUNTER — Non-Acute Institutional Stay (SKILLED_NURSING_FACILITY): Payer: Medicare Other | Admitting: Internal Medicine

## 2015-04-03 ENCOUNTER — Encounter: Payer: Self-pay | Admitting: Internal Medicine

## 2015-04-03 DIAGNOSIS — E785 Hyperlipidemia, unspecified: Secondary | ICD-10-CM

## 2015-04-03 DIAGNOSIS — G250 Essential tremor: Secondary | ICD-10-CM | POA: Diagnosis not present

## 2015-04-03 DIAGNOSIS — R251 Tremor, unspecified: Secondary | ICD-10-CM

## 2015-04-03 DIAGNOSIS — I10 Essential (primary) hypertension: Secondary | ICD-10-CM | POA: Diagnosis not present

## 2015-04-03 NOTE — Progress Notes (Signed)
Patient ID: Marisa Scott, female   DOB: [birth year], 82 y.o.   MRN: [id number]      [facility]   Code Status:  Full Code   No Known Allergies   Chief Complaint  Patient presents with  . Medical Management of Chronic Issues    Routine Visit    HPI 82 y/o female patient is seen for routine visit. She has been at her baseline with no new concerns from staff.    ROS Constitutional: Negative for fever HENT: Negative for congestion.   Eyes: Negative for blurred vision, double vision Respiratory: Negative for cough, shortness of breath and wheezing.   Cardiovascular: Negative for chest pain Gastrointestinal: Negative for heartburn, nausea, vomiting, abdominal pain.  Genitourinary: Negative for dysuria    Past medical history reviewed  Medication reviewed. See [facility]   Medication List       This list is accurate as of: 04/03/15  3:54 PM.  Always use your most recent med list.               acetaminophen 325 MG tablet  Commonly known as:  TYLENOL  Take 650 mg by mouth every 4 (four) hours as needed for mild pain.     AMBULATORY NON FORMULARY MEDICATION  Medication Name: Med Pass 90 mL twice daily for nutritional supplement     atorvastatin 10 MG tablet  Commonly known as:  LIPITOR  Take 10 mg by mouth daily.     benazepril 5 MG tablet  Commonly known as:  LOTENSIN  Take 1 tablet by mouth daily     cholecalciferol 1000 units tablet  Commonly known as:  VITAMIN D  Take 1,000 Units by mouth daily. For Senile Osteoporosis     clonazePAM 0.5 MG tablet  Commonly known as:  KLONOPIN  Take 1/2 tablet by mouth twice daily for mood     DECUBI-VITE PO  Take by mouth. 1 by mouth daily for step II Ulcer     donepezil 10 MG tablet  Commonly known as:  ARICEPT  Take 10 mg by mouth at bedtime.     insulin glargine 100 UNIT/ML injection  Commonly known as:  LANTUS  Inject 35 units subcutaneously every morning and inject 28 units subcutaneously  every evening to control blood sugar     INVEGA SUSTENNA 117 MG/0.75ML Susp  Generic drug:  Paliperidone Palmitate  Inject 0.40ml intramuscularly once a month on the 4th for schizaffective disorder     Lavender Oil Oil  by Does not apply route. Apply 1 drop behind each ear every night at bedtime as needed for relaxation     memantine 28 MG Cp24 24 hr capsule  Commonly known as:  NAMENDA XR  Take 1 tablet by mouth daily for dementia * do not crush or chew     mirtazapine 15 MG tablet  Commonly known as:  REMERON  Take 15 mg by mouth daily.     OYST-CAL 500 MG Tabs  Generic drug:  Oyster Shell Calcium  Take 1 tablet by mouth 2 (two) times daily.     primidone 50 MG tablet  Commonly known as:  MYSOLINE  Take 50 mg by mouth 3 (three) times daily.     SYSTANE FREE OP  Apply 1 drop to eye 3 (three) times daily. Both eyes     UNABLE TO FIND  Med Name: Magic Cup daily at 2 pm with Medpass     UTI-STAT Liqd  Take 30 mLs by mouth every morning.        Physical exam BP 120/70 mmHg  Pulse 80  Temp(Src) 97.8 F (36.6 C) (Oral)  Resp 16  Ht 5\' 1"  (1.549 m)  Wt 135 lb 3.2 oz (61.326 kg)  BMI 25.56 kg/m2  SpO2 95%  Wt Readings from Last 3 Encounters:  04/03/15 135 lb 3.2 oz (61.326 kg)  11/24/14 138 lb (62.596 kg)  10/29/14 135 lb 14.4 oz (61.644 kg)   General- elderly female, obese, in no acute distress Head- atraumatic, normocephalic Eyes- no pallor, no icterus, no discharge Neck- no lymphadenopathy Mouth- normal mucus membrane, adentulous Cardiovascular- normal s1,s2, systolic murmur +, leg edema +, diminished pedal pulses Respiratory- bilateral clear to auscultation, no wheeze, no rhonchi, no crackles Abdomen- bowel sounds present, soft, non tender, obese Musculoskeletal- able to move all 4 extremities, no contractures, non ambulatory, modified shoe for shortened LLE Neurological- one word answer sometimes otherwise incoherent speech Skin- warm and dry Psychiatry-  pleasantly confused, oriented to self  Labs CBC Latest Ref Rng 02/09/2015 07/30/2014 12/17/2013  WBC - 9.3 9.0 9.3  Hemoglobin 12.0 - 16.0 g/dL 11.6(A) 10.7(A) -  Hematocrit 36 - 46 % 38 33(A) -  Platelets 150 - 399 K/L 225 215 -   CMP Latest Ref Rng 02/09/2015 11/25/2014 03/31/2014  Glucose 70 - 99 mg/dL - - -  BUN 4 - 21 mg/dL 16 11 17   Creatinine 0.5 - 1.1 mg/dL 0.9 1.0 -  Sodium 147137 - 147 mmol/L 144 141 138  Potassium 3.4 - 5.3 mmol/L 3.9 4.4 4.1  Chloride 96 - 112 mEq/L - - -  CO2 19 - 32 mEq/L - - -  Calcium 8.4 - 10.5 mg/dL - - -  Total Protein 6.0 - 8.3 g/dL - - -  Total Bilirubin 0.3 - 1.2 mg/dL - - -  Alkaline Phos 25 - 125 U/L 90 113 -  AST 13 - 35 U/L 16 18 -  ALT 7 - 35 U/L 11 14 -   Lab Results  Component Value Date   HGBA1C 6.9 02/09/2015   Lipid Panel     Component Value Date/Time   CHOL 174 02/09/2015   TRIG 139 02/09/2015   HDL 42 02/09/2015   CHOLHDL 4.7 09/19/2008 1845   VLDL 57* 09/19/2008 1845   LDLCALC 104 02/09/2015   08/25/14 microalbuminuria present   ASSESSMENT/PLAN  HLD Reviewed lipid panel. Continue lipitor 10 mg daily for now  HTN Stable, continue benazepril 5 mg daily and monitor bp  Physiological tremor Continue mysoline 50 mg tid for now   Oneal GroutMAHIMA Veronique Warga, MD  [facility] [phone] (Monday-Friday 8 am - 5 pm) [phone] (afterhours)

## 2015-04-17 LAB — HEPATIC FUNCTION PANEL
ALT: 12 U/L (ref 7–35)
AST: 19 U/L (ref 13–35)
Alkaline Phosphatase: 89 U/L (ref 25–125)
Bilirubin, Total: 0.4 mg/dL

## 2015-04-17 LAB — BASIC METABOLIC PANEL
BUN: 20 mg/dL (ref 4–21)
CREATININE: 0.9 mg/dL (ref 0.5–1.1)
Glucose: 68 mg/dL
Potassium: 3.7 mmol/L (ref 3.4–5.3)
Sodium: 147 mmol/L (ref 137–147)

## 2015-04-17 LAB — CBC AND DIFFERENTIAL
HCT: 34 % — AB (ref 36–46)
Hemoglobin: 10.8 g/dL — AB (ref 12.0–16.0)
Platelets: 210 10*3/uL (ref 150–399)
WBC: 9.6 10*3/mL

## 2015-04-17 LAB — MICROALBUMIN, URINE: Microalb, Ur: 556.4

## 2015-05-13 ENCOUNTER — Non-Acute Institutional Stay (SKILLED_NURSING_FACILITY): Payer: Medicare Other | Admitting: Internal Medicine

## 2015-05-13 ENCOUNTER — Encounter: Payer: Self-pay | Admitting: Internal Medicine

## 2015-05-13 DIAGNOSIS — F32A Depression, unspecified: Secondary | ICD-10-CM

## 2015-05-13 DIAGNOSIS — E1121 Type 2 diabetes mellitus with diabetic nephropathy: Secondary | ICD-10-CM | POA: Diagnosis not present

## 2015-05-13 DIAGNOSIS — F418 Other specified anxiety disorders: Secondary | ICD-10-CM

## 2015-05-13 DIAGNOSIS — G308 Other Alzheimer's disease: Secondary | ICD-10-CM

## 2015-05-13 DIAGNOSIS — F0280 Dementia in other diseases classified elsewhere without behavioral disturbance: Secondary | ICD-10-CM | POA: Diagnosis not present

## 2015-05-13 DIAGNOSIS — F329 Major depressive disorder, single episode, unspecified: Secondary | ICD-10-CM

## 2015-05-13 DIAGNOSIS — F419 Anxiety disorder, unspecified: Secondary | ICD-10-CM

## 2015-05-13 NOTE — Progress Notes (Signed)
Patient ID: Marisa Scott, female   DOB: 1929-02-24, 82 y.o.   MRN: [id number]      [facility]   Code Status:  Full Code   Allergies  Allergen Reactions  . Rocephin [Ceftriaxone Sodium In Dextrose]      Chief Complaint  Patient presents with  . Medical Management of Chronic Issues    Routine Visit    HPI 82 y/o female patient is seen for routine visit. She has been at her baseline with no new concerns from staff. She completed her antibiotic treatment for UTI.   ROS: limited with her dementia Constitutional: Negative for fever HENT: Negative for congestion.   Respiratory: Negative for cough, shortness of breath and wheezing.   Cardiovascular: Negative for chest pain Gastrointestinal: Negative for heartburn, nausea, vomiting, abdominal pain.  Genitourinary: Negative for dysuria    Past medical history reviewed  Medication reviewed. See [facility]   Medication List       This list is accurate as of: 05/13/15  4:06 PM.  Always use your most recent med list.               acetaminophen 325 MG tablet  Commonly known as:  TYLENOL  Take 650 mg by mouth every 4 (four) hours as needed for mild pain.     AMBULATORY NON FORMULARY MEDICATION  Medication Name: Med Pass 90 mL twice daily for nutritional supplement     atorvastatin 10 MG tablet  Commonly known as:  LIPITOR  Take 10 mg by mouth daily.     benazepril 5 MG tablet  Commonly known as:  LOTENSIN  Take 1 tablet by mouth daily     cholecalciferol 1000 units tablet  Commonly known as:  VITAMIN D  Take 1,000 Units by mouth daily. For Senile Osteoporosis     clonazePAM 0.5 MG tablet  Commonly known as:  KLONOPIN  Take 1/2 tablet by mouth twice daily for mood     DECUBI-VITE PO  Take by mouth. 1 by mouth daily for step II Ulcer     donepezil 10 MG tablet  Commonly known as:  ARICEPT  Take 10 mg by mouth at bedtime.     insulin glargine 100 UNIT/ML injection  Commonly known as:   LANTUS  Inject 35 units subcutaneously every morning and inject 28 units subcutaneously every evening to control blood sugar     INVEGA SUSTENNA 117 MG/0.75ML Susp  Generic drug:  Paliperidone Palmitate  Inject 0.75ml intramuscularly once a month on the 4th for schizaffective disorder     Lavender Oil Oil  by Does not apply route. Apply 1 drop behind each ear every night at bedtime as needed for relaxation     memantine 28 MG Cp24 24 hr capsule  Commonly known as:  NAMENDA XR  Take 1 tablet by mouth daily for dementia * do not crush or chew     mirtazapine 15 MG tablet  Commonly known as:  REMERON  Take 15 mg by mouth daily.     OYST-CAL 500 MG Tabs  Generic drug:  Oyster Shell Calcium  Take 1 tablet by mouth 2 (two) times daily.     primidone 50 MG tablet  Commonly known as:  MYSOLINE  Take 50 mg by mouth 3 (three) times daily.     SYSTANE FREE OP  Apply 1 drop to eye 3 (three) times daily. Both eyes     UNABLE TO FIND  Med Name:  Magic Cup daily at 2 pm with Medpass     UTI-STAT Liqd  Take 30 mLs by mouth every morning.        Physical exam BP 130/50 mmHg  Pulse 86  Temp(Src) 96.8 F (36 C) (Oral)  Resp 18  Ht 5\' 1"  (1.549 m)  Wt 133 lb (60.328 kg)  BMI 25.14 kg/m2  SpO2 97%  Wt Readings from Last 3 Encounters:  05/13/15 133 lb (60.328 kg)  04/03/15 135 lb 3.2 oz (61.326 kg)  11/24/14 138 lb (62.596 kg)   General- elderly female, overweight, in no acute distress Head- atraumatic, normocephalic Eyes- no pallor, no icterus, no discharge Neck- no lymphadenopathy Mouth- normal mucus membrane, adentulous Cardiovascular- normal s1,s2, systolic murmur +, leg edema +, diminished pedal pulses Respiratory- bilateral clear to auscultation, no wheeze, no rhonchi, no crackles Abdomen- bowel sounds present, soft, non tender, obese Musculoskeletal- able to move all 4 extremities, no contractures, non ambulatory, modified shoe for shortened LLE Neurological- one word  answer sometimes otherwise incoherent speech Skin- warm and dry Psychiatry- pleasantly confused, oriented to self  Labs CBC Latest Ref Rng 04/17/2015 02/09/2015 07/30/2014  WBC - 9.6 9.3 9.0  Hemoglobin 12.0 - 16.0 g/dL 10.8(A) 11.6(A) 10.7(A)  Hematocrit 36 - 46 % 34(A) 38 33(A)  Platelets 150 - 399 K/L 210 225 215   CMP Latest Ref Rng 04/17/2015 02/09/2015 11/25/2014  Glucose 70 - 99 mg/dL - - -  BUN 4 - 21 mg/dL 20 16 11   Creatinine 0.5 - 1.1 mg/dL 0.9 0.9 1.0  Sodium 409137 - 147 mmol/L 147 144 141  Potassium 3.4 - 5.3 mmol/L 3.7 3.9 4.4  Chloride 96 - 112 mEq/L - - -  CO2 19 - 32 mEq/L - - -  Calcium 8.4 - 10.5 mg/dL - - -  Total Protein 6.0 - 8.3 g/dL - - -  Total Bilirubin 0.3 - 1.2 mg/dL - - -  Alkaline Phos 25 - 125 U/L 89 90 113  AST 13 - 35 U/L 19 16 18   ALT 7 - 35 U/L 12 11 14    Lab Results  Component Value Date   HGBA1C 6.9 02/09/2015   Lipid Panel     Component Value Date/Time   CHOL 174 02/09/2015   TRIG 139 02/09/2015   HDL 42 02/09/2015   CHOLHDL 4.7 09/19/2008 1845   VLDL 57* 09/19/2008 1845   LDLCALC 104 02/09/2015   08/25/14 microalbuminuria present   ASSESSMENT/PLAN  Depression and anxiety Stable mood overall. Continue remeron 15 mg daily and clonazepam 0.25 mg bid  Dementia Advanced and decline anticipated. Continue supportive care and namenda xr 28 mg daily and aricept 10 mg daily  DM type 2 with renal complications Lab Results  Component Value Date   HGBA1C 6.9 02/09/2015  monitor cbg. Continue lantus 35 u in am and 28 u in pm. Continue ACEI    Oneal GroutMAHIMA Katelin Kutsch, MD  [facility] [phone] (Monday-Friday 8 am - 5 pm) [phone] (afterhours)

## 2015-05-28 LAB — HEPATIC FUNCTION PANEL
ALK PHOS: 96 U/L (ref 25–125)
ALT: 13 U/L (ref 7–35)
AST: 12 U/L — AB (ref 13–35)
Bilirubin, Total: 0.3 mg/dL

## 2015-05-28 LAB — HEMOGLOBIN A1C: Hemoglobin A1C: 7.8

## 2015-05-28 LAB — BASIC METABOLIC PANEL
BUN: 24 mg/dL — AB (ref 4–21)
CREATININE: 0.8 mg/dL (ref 0.5–1.1)
Glucose: 144 mg/dL
Potassium: 4.1 mmol/L (ref 3.4–5.3)
Sodium: 139 mmol/L (ref 137–147)

## 2015-05-28 LAB — CBC AND DIFFERENTIAL
HEMATOCRIT: 35 % — AB (ref 36–46)
Hemoglobin: 10.5 g/dL — AB (ref 12.0–16.0)
Platelets: 214 10*3/uL (ref 150–399)
WBC: 8.8 10^3/mL

## 2015-05-28 LAB — LIPID PANEL
Cholesterol: 146 mg/dL (ref 0–200)
HDL: 43 mg/dL (ref 35–70)
LDL CALC: 74 mg/dL
Triglycerides: 141 mg/dL (ref 40–160)

## 2015-06-18 ENCOUNTER — Emergency Department (HOSPITAL_COMMUNITY)
Admission: EM | Admit: 2015-06-18 | Discharge: 2015-06-18 | Disposition: A | Discharge status: Home / Self Care | Payer: Medicare Other | Attending: Emergency Medicine | Admitting: Emergency Medicine

## 2015-06-18 ENCOUNTER — Encounter (HOSPITAL_COMMUNITY): Payer: Self-pay | Admitting: Emergency Medicine

## 2015-06-18 DIAGNOSIS — Z792 Long term (current) use of antibiotics: Secondary | ICD-10-CM | POA: Diagnosis not present

## 2015-06-18 DIAGNOSIS — G479 Sleep disorder, unspecified: Secondary | ICD-10-CM | POA: Diagnosis present

## 2015-06-18 DIAGNOSIS — E119 Type 2 diabetes mellitus without complications: Secondary | ICD-10-CM | POA: Diagnosis not present

## 2015-06-18 DIAGNOSIS — F319 Bipolar disorder, unspecified: Secondary | ICD-10-CM | POA: Insufficient documentation

## 2015-06-18 DIAGNOSIS — N390 Urinary tract infection, site not specified: Secondary | ICD-10-CM | POA: Diagnosis not present

## 2015-06-18 DIAGNOSIS — M1990 Unspecified osteoarthritis, unspecified site: Secondary | ICD-10-CM | POA: Insufficient documentation

## 2015-06-18 DIAGNOSIS — I10 Essential (primary) hypertension: Secondary | ICD-10-CM | POA: Diagnosis not present

## 2015-06-18 DIAGNOSIS — Z794 Long term (current) use of insulin: Secondary | ICD-10-CM | POA: Diagnosis not present

## 2015-06-18 DIAGNOSIS — G20 Parkinson's disease: Secondary | ICD-10-CM | POA: Diagnosis not present

## 2015-06-18 DIAGNOSIS — F0390 Unspecified dementia without behavioral disturbance: Secondary | ICD-10-CM | POA: Insufficient documentation

## 2015-06-18 LAB — BASIC METABOLIC PANEL
Anion gap: 7 (ref 5–15)
BUN: 15 mg/dL (ref 6–20)
CHLORIDE: 106 mmol/L (ref 101–111)
CO2: 24 mmol/L (ref 22–32)
Calcium: 8.3 mg/dL — ABNORMAL LOW (ref 8.9–10.3)
Creatinine, Ser: 0.88 mg/dL (ref 0.44–1.00)
GFR calc Af Amer: >60 mL/min (ref >60)
GFR, EST NON AFRICAN AMERICAN: 59 mL/min — AB (ref >60)
GLUCOSE: 330 mg/dL — AB (ref 65–99)
POTASSIUM: 4 mmol/L (ref 3.5–5.1)
Sodium: 137 mmol/L (ref 135–145)

## 2015-06-18 LAB — CBC
HEMATOCRIT: 34.7 % — AB (ref 36.0–46.0)
Hemoglobin: 10.9 g/dL — ABNORMAL LOW (ref 12.0–15.0)
MCH: 26.3 pg (ref 26.0–34.0)
MCHC: 31.4 g/dL (ref 30.0–36.0)
MCV: 83.6 fL (ref 78.0–100.0)
PLATELETS: 237 10*3/uL (ref 150–400)
RBC: 4.15 MIL/uL (ref 3.87–5.11)
RDW: 14.9 % (ref 11.5–15.5)
WBC: 7.9 10*3/uL (ref 4.0–10.5)

## 2015-06-18 LAB — URINE MICROSCOPIC-ADD ON

## 2015-06-18 LAB — URINALYSIS, ROUTINE W REFLEX MICROSCOPIC
BILIRUBIN URINE: NEGATIVE
Glucose, UA: 500 mg/dL — AB
Ketones, ur: NEGATIVE mg/dL
NITRITE: NEGATIVE
PH: 6.5 (ref 5.0–8.0)
Protein, ur: 30 mg/dL — AB
SPECIFIC GRAVITY, URINE: 1.019 (ref 1.005–1.030)

## 2015-06-18 MED ORDER — SULFAMETHOXAZOLE-TRIMETHOPRIM 800-160 MG PO TABS: 1.0000 | ORAL_TABLET | Freq: Two times a day (BID) | ORAL | Status: AC

## 2015-06-18 MED ORDER — ONDANSETRON 4 MG PO TBDP: 4.0000 mg | ORAL_TABLET | Freq: Two times a day (BID) | ORAL | Status: AC | PRN

## 2015-06-18 NOTE — ED Notes (Signed)
[facility] called for transport.  

## 2015-06-18 NOTE — Discharge Instructions (Signed)
Antibiotic Medicine °Antibiotic medicines are used to treat infections caused by bacteria. They work by injuring or killing the bacteria that is making you sick. °HOW IS AN ANTIBIOTIC CHOSEN? °An antibiotic is chosen based on many factors. To help your health care provider choose one for you, tell your health care provider if: °· You have any allergies. °· You are pregnant or plan to get pregnant. °· You are breastfeeding. °· You are taking any medicines. These include over-the-counter medicines, prescription medicines, and herbal remedies. °· You have a medical condition or problem you have not already discussed. °Your health care provider will also consider: °· How often the medicine has to be taken. °· Common side effects of the medicine. °· The cost of the medicine. °· The taste of the medicine. °If you have questions about why an antibiotic was chosen, make sure to ask. °FOR HOW LONG SHOULD I TAKE MY ANTIBIOTIC? °Continue to take your antibiotic for as long as told by your health care provider. Do not stop taking it when you feel better. If you stop taking it too soon: °· You may start to feel sick again. °· Your infection may become harder to treat. °· Complications may develop. °WHAT IF I MISS A DOSE? °Try not to miss any doses of medicine. If you miss a dose, take it as soon as possible. However, if it is almost time for the next dose: °· If you are taking 2 doses per day, take the missed dose and the next dose 5 to 6 hours apart. °· If you are taking 3 or more doses per day, take the missed dose and the next dose 2 to 4 hours apart, then go back to the normal schedule. °If you cannot make up a missed dose, take the next scheduled dose on time. Then take the missed dose after you have taken all the doses as recommended by your health care provider, as if you had one more dose left. °DO ANTIBIOTICS AFFECT BIRTH CONTROL? °Birth control pills may not work while you are on antibiotics. If you are taking birth  control pills, continue taking them as usual and use a second form of birth control, such as a condom, to avoid unwanted pregnancy. Continue using the second form of birth control until you are finished with your current 1 month cycle of birth control pills. °OTHER INFORMATION °· If there is any medicine left over, throw it away. °· Never take someone else's antibiotics. °· Never take leftover antibiotics. °SEEK MEDICAL CARE IF: °· You get worse. °· You do not feel better within a few days of starting the antibiotic medicine. °· You vomit. °· White patches appear in your mouth. °· You have new joint pain that begins after starting the antibiotic. °· You have new muscle aches that begin after starting the antibiotic. °· You had a fever before starting the antibiotic and it returns. °· You have any symptoms of an allergic reaction, such as an itchy rash. If this happens, stop taking the antibiotic. °SEEK IMMEDIATE MEDICAL CARE IF: °· Your urine turns dark or becomes blood-colored. °· Your skin turns yellow. °· You bruise or bleed easily. °· You have severe diarrhea and abdominal cramps. °· You have a severe headache. °· You have signs of a severe allergic reaction, such as: °¨ Trouble breathing. °¨ Wheezing. °¨ Swelling of the lips, tongue, or face. °¨ Fainting. °¨ Blisters on the skin or in the mouth. °If you have signs of a severe allergic   reaction, stop taking the antibiotic right away. °  °This information is not intended to replace advice given to you by your health care provider. Make sure you discuss any questions you have with your health care provider. °  °Document Released: 09/30/2003 Document Revised: 10/08/2014 Document Reviewed: 06/04/2014 °Elsevier Interactive Patient Education ©2016 [employer] ° °Urinary Tract Infection °Urinary tract infections (UTIs) can develop anywhere along your urinary tract. Your urinary tract is your body's drainage system for removing wastes and extra water. Your urinary  tract includes two kidneys, two ureters, a bladder, and a urethra. Your kidneys are a pair of bean-shaped organs. Each kidney is about the size of your fist. They are located below your ribs, one on each side of your spine. °CAUSES °Infections are caused by microbes, which are microscopic organisms, including fungi, viruses, and bacteria. These organisms are so small that they can only be seen through a microscope. Bacteria are the microbes that most commonly cause UTIs. °SYMPTOMS  °Symptoms of UTIs may vary by age and gender of the patient and by the location of the infection. Symptoms in young women typically include a frequent and intense urge to urinate and a painful, burning feeling in the bladder or urethra during urination. Older women and men are more likely to be tired, shaky, and weak and have muscle aches and abdominal pain. A fever may mean the infection is in your kidneys. Other symptoms of a kidney infection include pain in your back or sides below the ribs, nausea, and vomiting. °DIAGNOSIS °To diagnose a UTI, your caregiver will ask you about your symptoms. Your caregiver will also ask you to provide a urine sample. The urine sample will be tested for bacteria and white blood cells. White blood cells are made by your body to help fight infection. °TREATMENT  °Typically, UTIs can be treated with medication. Because most UTIs are caused by a bacterial infection, they usually can be treated with the use of antibiotics. The choice of antibiotic and length of treatment depend on your symptoms and the type of bacteria causing your infection. °HOME CARE INSTRUCTIONS °· If you were prescribed antibiotics, take them exactly as your caregiver instructs you. Finish the medication even if you feel better after you have only taken some of the medication. °· Drink enough water and fluids to keep your urine clear or pale yellow. °· Avoid caffeine, tea, and carbonated beverages. They tend to irritate your  bladder. °· Empty your bladder often. Avoid holding urine for long periods of time. °· Empty your bladder before and after sexual intercourse. °· After a bowel movement, women should cleanse from front to back. Use each tissue only once. °SEEK MEDICAL CARE IF:  °· You have back pain. °· You develop a fever. °· Your symptoms do not begin to resolve within 3 days. °SEEK IMMEDIATE MEDICAL CARE IF:  °· You have severe back pain or lower abdominal pain. °· You develop chills. °· You have nausea or vomiting. °· You have continued burning or discomfort with urination. °MAKE SURE YOU:  °· Understand these instructions. °· Will watch your condition. °· Will get help right away if you are not doing well or get worse. °  °This information is not intended to replace advice given to you by your health care provider. Make sure you discuss any questions you have with your health care provider. °  °Document Released: 10/27/2004 Document Revised: 10/08/2014 Document Reviewed: 02/25/2011 °Elsevier Interactive Patient Education ©2016 [employer]vier Inc. ° °

## 2015-06-18 NOTE — ED Provider Notes (Signed)
CSN: [id number]     Arrival date & time 06/18/15  1208 History   First MD Initiated Contact with Patient 06/18/15 1234     Chief Complaint  Patient presents with  . difficult to wake this morning       (Consider location/radiation/quality/duration/timing/severity/associated sxs/prior Treatment) The history is provided by medical records, the nursing home and the EMS personnel. The history is limited by the condition of the patient and the absence of a caregiver. No language interpreter was used.     Marisa Scott is a 82 y.o. female, PMHx of HTN, dementia, schizophrenia, bipolar 1, anxiety, alzheimers, DM, she is a resident of [facility] who was sent to the ER requesting evaluation because she was difficult to wake up this morning, which is unusual for her, however after getting up is reportedly at her baseline mental-status and orientation.    Report from EMS:  "Per ems, staff from [facility] called due to pt being difficult to awaken/"not being her normal self". Staff state pt was at baseline when EMS arrived but still wanted patient evaluated. Pt has hx of type 2 DM, CBG was 232. Pt has not received morning medications.  [facility] contacted, state the patient is at baseline but has never been this difficult to awaken, state supervisor wanted pt evaluated. "  ED nursing staff attempted to cal [facility] for more information.  Staff there indicated that pt recently had been treated for UTI, but completed medications.  There is no antibiotics listed in the pt's medication history from [facility].  There are no available family members.   Level 5 caveat secondary to dementia  Past Medical History  Diagnosis Date  . Hypertension   . Dementia   . Schizophrenia (HCC)   . Bipolar 1 disorder (HCC)   . Anxiety   . Alzheimer's disease   . DMII (diabetes mellitus, type 2) (HCC)   . Osteoporosis   . Hyperlipidemia   . Esophageal reflux   . Constipation   . Anemia   .  Hypokalemia   . Tremors of nervous system   . Parkinson disease (HCC)   . Venous insufficiency   . Osteoarthritis   . Venous insufficiency    History reviewed. No pertinent past surgical history. History reviewed. No pertinent family history. Social History  Substance Use Topics  . Smoking status: Never Smoker   . Smokeless tobacco: Never Used  . Alcohol Use: No   OB History    No data available     Review of Systems  Unable to perform ROS: Dementia      Allergies  Rocephin  Home Medications   Prior to Admission medications   Medication Sig Start Date End Date Taking? Authorizing Provider  acetaminophen (TYLENOL) 325 MG tablet Take 650 mg by mouth every 4 (four) hours as needed for mild pain.   Yes Historical Provider, MD  AMBULATORY NON FORMULARY MEDICATION Medication Name: Med Pass 90 mL twice daily for nutritional supplement   Yes Historical Provider, MD  atorvastatin (LIPITOR) 10 MG tablet Take 10 mg by mouth daily.   Yes Historical Provider, MD  benazepril (LOTENSIN) 5 MG tablet Take 1 tablet by mouth daily   Yes Historical Provider, MD  cholecalciferol (VITAMIN D) 1000 UNITS tablet Take 1,000 Units by mouth daily. For Senile Osteoporosis   Yes Historical Provider, MD  clonazePAM (KLONOPIN) 0.5 MG tablet Take 1/2 tablet by mouth twice daily for mood Patient taking differently: Take 0.25 mg by  mouth 2 (two) times daily.  03/06/14  Yes Tiffany L Reed, DO  [employer] Extract (T/GEL EXTRA STRENGTH EX) Apply 1 application topically Marisa admin instructions. Uses on shower days   Yes Historical Provider, MD  Cranberry-Vitamin C-Inulin (UTI-STAT) LIQD Take 30 mLs by mouth every morning.   Yes Historical Provider, MD  donepezil (ARICEPT) 10 MG tablet Take 10 mg by mouth at bedtime.   Yes Historical Provider, MD  insulin glargine (LANTUS) 100 UNIT/ML injection Inject 28-35 Units into the skin 2 (two) times daily. Inject 35 units subcutaneously every morning and inject 28 units  subcutaneously every evening to control blood sugar   Yes Historical Provider, MD  Lavender Oil OIL Apply 1 drop topically at bedtime as needed (for relaxation). Apply 1 drop behind each ear every night at bedtime as needed for relaxation   Yes Historical Provider, MD  memantine (NAMENDA XR) 28 MG CP24 24 hr capsule Take 28 mg by mouth daily.    Yes Historical Provider, MD  miconazole (BAZA ANTIFUNGAL) 2 % cream Apply 1 application topically 3 (three) times daily. Apply to groin perineal area   Yes Historical Provider, MD  mirtazapine (REMERON) 15 MG tablet Take 15 mg by mouth daily.   Yes Historical Provider, MD  Multiple Vitamins-Minerals (DECUBI-VITE) CAPS Take 1 capsule by mouth daily.   Yes Historical Provider, MD  NONFORMULARY OR COMPOUNDED ITEM Apply 1 application topically 2 (two) times daily. *Ketoncocr : Cutivatecr 1:1*   Yes Historical Provider, MD  Ethelda Chick Calcium (OYST-CAL) 500 MG TABS Take 1 tablet by mouth 2 (two) times daily.    Yes Historical Provider, MD  Paliperidone Palmitate (INVEGA SUSTENNA) 117 MG/0.75ML SUSP Inject 0.67ml intramuscularly once a month on the 4th for schizaffective disorder   Yes Historical Provider, MD  Polyethyl Glycol-Propyl Glycol (SYSTANE FREE OP) Place 1 drop into both eyes 3 (three) times daily.    Yes Historical Provider, MD  primidone (MYSOLINE) 50 MG tablet Take 50 mg by mouth 3 (three) times daily.   Yes Historical Provider, MD  Triclosan (CETAPHIL ANTIBACTERIAL) 0.3 % BAR Apply 1 application topically daily. Uses on perineal area   Yes Historical Provider, MD  UNABLE TO FIND Med Name: Magic Cup daily at 2 pm with Medpass   Yes Historical Provider, MD  Zinc Oxide (SECURA EXTRA PROTECTIVE EX) Apply 1 application topically 3 (three) times daily. Apply to buttocks   Yes Historical Provider, MD  zinc oxide 20 % ointment Apply 1 application topically as needed for irritation.   Yes Historical Provider, MD  ondansetron (ZOFRAN ODT) 4 MG disintegrating  tablet Take 1 tablet (4 mg total) by mouth every 12 (twelve) hours as needed for nausea or vomiting. 06/18/15   Danelle Berry, PA-C  sulfamethoxazole-trimethoprim (BACTRIM DS,SEPTRA DS) 800-160 MG tablet Take 1 tablet by mouth 2 (two) times daily. 06/18/15 06/25/15  Danelle Berry, PA-C   BP 144/68 mmHg  Pulse 87  Temp(Src) 98 F (36.7 C) (Oral)  Resp 18  SpO2 98% Physical Exam  Constitutional: She appears well-developed.  HENT:  Head: Normocephalic and atraumatic.  Nose: Nose normal.  Eyes: Conjunctivae and EOM are normal. Pupils are equal, round, and reactive to light. Right eye exhibits no discharge. Left eye exhibits no discharge.  Neck: Normal range of motion. Neck supple.  Cardiovascular: Normal rate, regular rhythm and intact distal pulses.  Exam reveals no gallop and no friction rub.   Murmur heard. Pulmonary/Chest: Effort normal and breath sounds normal. No respiratory distress. She  has no wheezes.  Abdominal: Soft. Bowel sounds are normal. She exhibits no distension. There is no tenderness. There is no rebound and no guarding.  Musculoskeletal: She exhibits no edema or tenderness.  Neurological: She is alert. She displays no tremor. She exhibits abnormal muscle tone. She displays no seizure activity. GCS eye subscore is 4. GCS verbal subscore is 4. GCS motor subscore is 5.  Oriented to person  Skin: Skin is warm and dry.  Nursing note and vitals reviewed.   ED Course  Procedures (including critical care time) Labs Review Labs Reviewed  URINALYSIS, ROUTINE W REFLEX MICROSCOPIC (NOT AT [facility]) - Abnormal; Notable for the following:    Glucose, UA 500 (*)    Hgb urine dipstick SMALL (*)    Protein, ur 30 (*)    Leukocytes, UA TRACE (*)    All other components within normal limits  CBC - Abnormal; Notable for the following:    Hemoglobin 10.9 (*)    HCT 34.7 (*)    All other components within normal limits  BASIC METABOLIC PANEL - Abnormal; Notable for the following:    Glucose,  Bld 330 (*)    Calcium 8.3 (*)    GFR calc non Af Amer 59 (*)    All other components within normal limits  URINE MICROSCOPIC-ADD ON - Abnormal; Notable for the following:    Squamous Epithelial / LPF 0-5 (*)    Bacteria, UA RARE (*)    All other components within normal limits  URINE CULTURE    Imaging Review No results found. I have personally reviewed and evaluated these images and lab results as part of my medical decision-making.   EKG Interpretation None      MDM   Elderly demented female, non-toxic appearing, presents to the ER via EMS from [facility] requesting evaluation for "difficult to wake up this morning."  The pt is reportedly at her baseline, however supervisor at nursing facility wanted her evaluated.  Pt is alert in the ER, oriented to person only, and pleasantly demented, attempts to verbally respond to questions, however difficult to understand answers, and sometimes just mumbles or grunts.  No family to ask wishes, and no DNR info available re: wishes for invasive testing/procedures, etc. Case discussed with Dr. Dalene SeltzerSchlossman who states basic labs and UA are appropriate.   Ua pertinent for UTI.  ED pharmacist consulted for medication choice since the pt is elderly and has allergy to rocephin.  ED pharmacist advised bactrim.  Pt given Rx for bactrim and was discharged home.  She was well appearing, VSS. Filed Vitals:   06/18/15 1220 06/18/15 1552 06/18/15 1749 06/18/15 2008  BP: 132/52 125/62 135/64 144/68  Pulse: 89 86 90 87  Temp: 97.8 F (36.6 C)   98 F (36.7 C)  TempSrc: Oral   Oral  Resp: 16 18 18 18   SpO2: 98% 96% 100% 98%    Final diagnoses:  UTI (lower urinary tract infection)      Danelle BerryLeisa Wilder Kurowski, PA-C 06/25/15 0123  Alvira MondayErin Schlossman, MD 06/26/15 [phone]

## 2015-06-18 NOTE — ED Notes (Signed)
[facility] Expected date:  Expected time:  Means of arrival:  Comments: EMS- 82yo F, altered earlier/symptoms resolved

## 2015-06-18 NOTE — Progress Notes (Addendum)
ED CM spoke with pt briefly as CM left another pt room Pt lying on stretcher quietly No teeth  Hard to understand pt responses to how she is doing Pt did mumble she is ready to go home but unable to tell CM where home is  CM reviewed EPIC to note pt from [facility]  SW consult entered in EPIC for pt from [facility] With review of snf forms and FYI pt with dx alzheimer + Patient is a ward of [facility].  Assigned social worker is Roxana HiresMichelle Juchatz, [phone].  After-hours contact number is [phone].

## 2015-06-18 NOTE — ED Notes (Addendum)
Per ems, [facility] due to pt being difficult to awaken/"not being her normal self". Staff state pt was at baseline when EMS arrived but still wanted patient evaluated. Pt has hx of type 2 DM, CBG was 232. Pt has not received morning medications.  [facility] contacted, state the patient is at baseline but has never been this difficult to awaken, state supervisor wanted pt evaluated.

## 2015-06-20 LAB — URINE CULTURE: CULTURE: NO GROWTH

## 2015-07-03 ENCOUNTER — Encounter: Payer: Self-pay | Admitting: Internal Medicine

## 2015-07-03 ENCOUNTER — Non-Acute Institutional Stay (SKILLED_NURSING_FACILITY): Payer: Medicare Other | Admitting: Internal Medicine

## 2015-07-03 DIAGNOSIS — L89622 Pressure ulcer of left heel, stage 2: Secondary | ICD-10-CM | POA: Diagnosis not present

## 2015-07-03 DIAGNOSIS — N390 Urinary tract infection, site not specified: Secondary | ICD-10-CM

## 2015-07-03 NOTE — Progress Notes (Signed)
Patient ID: Marisa Scott, female   DOB: Nov 30, 1929, 82 y.o.   MRN: [id number]      [facility]   Code Status:  Full Code   Allergies  Allergen Reactions  . Rocephin [Ceftriaxone Sodium In Dextrose]     Unknown reaction per [facility]      Chief Complaint  Patient presents with  . Medical Management of Chronic Issues    Routine Visit    HPI 82 y/o female patient is seen for routine visit. She has been at her baseline with no new concerns from staff. She completed her antibiotic treatment for UTI.   ROS: limited with her dementia Constitutional: Negative for fever HENT: Negative for congestion.   Respiratory: Negative for cough Cardiovascular: Negative for chest pain Gastrointestinal: Negative for heartburn, vomiting  Genitourinary: Negative for dysuria    Past medical history reviewed  Medication reviewed. See [facility]   Medication List       This list is accurate as of: 07/03/15  3:40 PM.  Always use your most recent med list.               acetaminophen 325 MG tablet  Commonly known as:  TYLENOL  Take 650 mg by mouth every 4 (four) hours as needed for mild pain.     AMBULATORY NON FORMULARY MEDICATION  Medication Name: Med Pass 90 mL twice daily for nutritional supplement     atorvastatin 10 MG tablet  Commonly known as:  LIPITOR  Take 10 mg by mouth daily.     BAZA ANTIFUNGAL 2 % cream  Generic drug:  miconazole  Apply 1 application topically 3 (three) times daily. Apply to groin perineal area     benazepril 5 MG tablet  Commonly known as:  LOTENSIN  Reported on 07/03/2015     CETAPHIL ANTIBACTERIAL 0.3 % Bar  Generic drug:  Triclosan  Apply 1 application topically daily. Uses on perineal area     cholecalciferol 1000 units tablet  Commonly known as:  VITAMIN D  Take 1,000 Units by mouth daily. For Senile Osteoporosis     clonazePAM 0.5 MG tablet  Commonly known as:  KLONOPIN  Take 0.5 mg by mouth 2 (two) times daily.     DECUBI-VITE Caps  Take 1 capsule by mouth daily.     donepezil 10 MG tablet  Commonly known as:  ARICEPT  Take 10 mg by mouth at bedtime.     insulin glargine 100 UNIT/ML injection  Commonly known as:  LANTUS  Inject 28-35 Units into the skin 2 (two) times daily. Inject 35 units subcutaneously every morning and inject 28 units subcutaneously every evening to control blood sugar     INVEGA SUSTENNA 117 MG/0.75ML Susp  Generic drug:  Paliperidone Palmitate  Inject 0.96ml intramuscularly once a month on the 4th for schizaffective disorder     Lavender Oil Oil  Apply 1 drop topically at bedtime as needed (for relaxation). Apply 1 drop behind each ear every night at bedtime as needed for relaxation     memantine 28 MG Cp24 24 hr capsule  Commonly known as:  NAMENDA XR  Take 28 mg by mouth daily.     mirtazapine 15 MG tablet  Commonly known as:  REMERON  Take 15 mg by mouth daily.     NONFORMULARY OR COMPOUNDED ITEM  Apply 1 application topically 2 (two) times daily. *Ketoncocr : Cutivatecr 1:1*     ondansetron 4 MG disintegrating tablet  Commonly known as:  ZOFRAN ODT  Take 1 tablet (4 mg total) by mouth every 12 (twelve) hours as needed for nausea or vomiting.     OYST-CAL 500 MG Tabs  Generic drug:  Oyster Shell Calcium  Take 1 tablet by mouth 2 (two) times daily.     primidone 50 MG tablet  Commonly known as:  MYSOLINE  Take 50 mg by mouth 3 (three) times daily.     SYSTANE FREE OP  Place 1 drop into both eyes 3 (three) times daily.     T/GEL EXTRA STRENGTH EX  Apply 1 application topically See admin instructions. Uses on shower days     UNABLE TO FIND  Med Name: Magic Cup daily at 2 pm with Medpass     UTI-STAT Liqd  Take 30 mLs by mouth every morning.     zinc oxide 20 % ointment  Apply 1 application topically as needed for irritation.     SECURA EXTRA PROTECTIVE EX  Apply 1 application topically 3 (three) times daily. Apply to buttocks        Physical  exam BP 110/67 mmHg  Pulse 64  Temp(Src) 96.8 F (36 C) (Oral)  Resp 18  Ht 5\' 1"  (1.549 m)  Wt 133 lb 12.8 oz (60.691 kg)  BMI 25.29 kg/m2  SpO2 96%  Wt Readings from Last 3 Encounters:  07/03/15 133 lb 12.8 oz (60.691 kg)  05/13/15 133 lb (60.328 kg)  04/03/15 135 lb 3.2 oz (61.326 kg)   General- elderly female, overweight, in no acute distress Head- atraumatic, normocephalic Eyes- no pallor, no icterus Neck- no lymphadenopathy Mouth- normal mucus membrane, adentulous Cardiovascular- normal s1,s2, systolic murmur +, leg edema +, diminished pedal pulses Respiratory- bilateral clear to auscultation Abdomen- bowel sounds present, soft, non tender, obese Musculoskeletal- able to move all 4 extremities, no contractures, non ambulatory, modified shoe for shortened LLE Neurological- pleasantly confused Skin- warm and dry   Labs CBC Latest Ref Rng 06/18/2015 05/28/2015 04/17/2015  WBC 4.0 - 10.5 K/uL 7.9 8.8 9.6  Hemoglobin 12.0 - 15.0 g/dL 10.9(L) 10.5(A) 10.8(A)  Hematocrit 36.0 - 46.0 % 34.7(L) 35(A) 34(A)  Platelets 150 - 400 K/uL 237 214 210   CMP Latest Ref Rng 06/18/2015 05/28/2015 04/17/2015  Glucose 65 - 99 mg/dL 161(W330(H) - -  BUN 6 - 20 mg/dL 15 96(E24(A) 20  Creatinine 0.44 - 1.00 mg/dL 4.540.88 0.8 0.9  Sodium 098135 - 145 mmol/L 137 139 147  Potassium 3.5 - 5.1 mmol/L 4.0 4.1 3.7  Chloride 101 - 111 mmol/L 106 - -  CO2 22 - 32 mmol/L 24 - -  Calcium 8.9 - 10.3 mg/dL 8.3(L) - -  Total Protein 6.0 - 8.3 g/dL - - -  Total Bilirubin 0.3 - 1.2 mg/dL - - -  Alkaline Phos 25 - 125 U/L - 96 89  AST 13 - 35 U/L - 12(A) 19  ALT 7 - 35 U/L - 13 12   Lab Results  Component Value Date   HGBA1C 7.8 05/28/2015   Lipid Panel     Component Value Date/Time   CHOL 146 05/28/2015   TRIG 141 05/28/2015   HDL 43 05/28/2015   CHOLHDL 4.7 09/19/2008 1845   VLDL 57* 09/19/2008 1845   LDLCALC 74 05/28/2015   08/25/14 microalbuminuria present   ASSESSMENT/PLAN  UTI Completed her  antibiotic course, perineal hygiene and hydration to be maintained  Left heel pressure ulcer Continue wound care, heel floaters and pressure ulcer prophylaxis  Recurrent UTI Continue perineal hygiene, encourage hydration. Continue UTI stat for uti prophylaxis    Oneal Grout, MD  [facility] [phone] (Monday-Friday 8 am - 5 pm) [phone] (afterhours)

## 2015-08-18 ENCOUNTER — Encounter: Payer: Self-pay | Admitting: Internal Medicine

## 2015-08-18 ENCOUNTER — Non-Acute Institutional Stay (SKILLED_NURSING_FACILITY): Payer: Medicare Other | Admitting: Internal Medicine

## 2015-08-18 DIAGNOSIS — L8992 Pressure ulcer of unspecified site, stage 2: Secondary | ICD-10-CM

## 2015-08-18 DIAGNOSIS — L03031 Cellulitis of right toe: Secondary | ICD-10-CM | POA: Diagnosis not present

## 2015-08-18 DIAGNOSIS — E1159 Type 2 diabetes mellitus with other circulatory complications: Secondary | ICD-10-CM | POA: Diagnosis not present

## 2015-08-18 DIAGNOSIS — E1121 Type 2 diabetes mellitus with diabetic nephropathy: Secondary | ICD-10-CM

## 2015-08-18 NOTE — Progress Notes (Signed)
Patient ID: Marisa Scott, female   DOB: 24-Aug-1929, 82 y.o.   MRN: [id number]      [facility]   Code Status:  Full Code   Allergies  Allergen Reactions  . Rocephin [Ceftriaxone Sodium In Dextrose]     [facility] reaction [facility]      Chief Complaint  Patient presents with  . Medical Management of Chronic Issues    Routine Visit    HPI 82 y/o female patient is seen for routine visit. She recently completed antibiotic treatment for cellulitis. She continues to received wound care for her pressure ulcers. She has been at her baseline with no new concerns from staff. She has been working with restorative team at present.   ROS: limited with her dementia Constitutional: Negative for fever HENT: Negative for congestion.   Respiratory: Negative for cough, shortness of breath and wheezing.   Cardiovascular: Negative for chest pain Gastrointestinal: Negative for heartburn, nausea, vomiting, abdominal pain.     Past medical history reviewed  Medication reviewed. See [facility]   Medication List       This list is accurate as of: 08/18/15  2:22 PM.  Always use your most recent med list.               AMBULATORY NON FORMULARY MEDICATION  Medication Name: Med Pass 90 mL twice daily for nutritional supplement     atorvastatin 10 MG tablet  Commonly known as:  LIPITOR  Take 10 mg by mouth daily.     benazepril 5 MG tablet  Commonly known as:  LOTENSIN  Take 5 mg by mouth daily.     cholecalciferol 1000 units tablet  Commonly known as:  VITAMIN D  Take 1,000 Units by mouth daily. For Senile Osteoporosis     clonazePAM 0.5 MG tablet  Commonly known as:  KLONOPIN  Take 0.5 mg by mouth 2 (two) times daily.     DECUBI-VITE Caps  Take 1 capsule by mouth daily.     donepezil 10 MG tablet  Commonly known as:  ARICEPT  Take 10 mg by mouth at bedtime.     insulin glargine 100 UNIT/ML injection  Commonly known as:  LANTUS  Inject 28-35 Units into the  skin 2 (two) times daily. Inject 35 units subcutaneously every morning and inject 28 units subcutaneously every evening to control blood sugar     INVEGA SUSTENNA 117 MG/0.75ML Susp  Generic drug:  Paliperidone Palmitate  Inject 0.2875ml intramuscularly once a month on the 4th for schizaffective disorder     Lavender Oil Oil  Apply 1 drop topically at bedtime as needed (for relaxation). Apply 1 drop behind each ear every night at bedtime as needed for relaxation     memantine 28 MG Cp24 24 hr capsule  Commonly known as:  NAMENDA XR  Take 28 mg by mouth daily.     mirtazapine 15 MG tablet  Commonly known as:  REMERON  Take 15 mg by mouth daily.     ondansetron 4 MG disintegrating tablet  Commonly known as:  ZOFRAN ODT  Take 1 tablet (4 mg total) by mouth every 12 (twelve) hours as needed for nausea or vomiting.     OYST-CAL 500 MG Tabs  Generic drug:  Oyster Shell Calcium  Take 1 tablet by mouth 2 (two) times daily.     primidone 50 MG tablet  Commonly known as:  MYSOLINE  Take 50 mg by mouth 3 (three)  times daily.     SYSTANE FREE OP  Place 1 drop into both eyes 3 (three) times daily.     UNABLE TO FIND  Med Name: Magic Cup daily at 2 pm with Medpass     UTI-STAT Liqd  Take 30 mLs by mouth every morning.     zinc oxide 20 % ointment  Apply 1 application topically as needed for irritation.     SECURA EXTRA PROTECTIVE EX  Apply 1 application topically 3 (three) times daily. Apply to buttocks        Physical exam BP 128/63 mmHg  Pulse 86  Temp(Src) 98.4 F (36.9 C) (Oral)  Resp 18  Ht  (1.549 m)  Wt 133 lb 12.8 oz (60.691 kg)  BMI 25.29 kg/m2  SpO2 93%  Wt Readings from Last 3 Encounters:  08/18/15 133 lb 12.8 oz (60.691 kg)  07/03/15 133 lb 12.8 oz (60.691 kg)  05/13/15 133 lb (60.328 kg)   General- elderly female, in no acute distress Head- atraumatic, normocephalic Eyes- no pallor, no icterus, no discharge Neck- no lymphadenopathy Mouth- normal  mucus membrane, edentulous Cardiovascular- normal s1,s2, systolic murmur +, leg edema +, diminished pedal pulses Respiratory- bilateral clear to auscultation, no wheeze, no rhonchi, no crackles Abdomen- bowel sounds present, soft, non tender, obese Musculoskeletal- able to move all 4 extremities, no contractures, non ambulatory, modified shoe for shortened LLE, able to propel herself in wheelchair for short distances, hoyer lift transfers Neurological- one word answer sometimes otherwise incoherent speech Skin- warm and dry, left lateral heel stage 2 pressure ulcer and has prevalon boots Psychiatry- pleasantly confused, oriented to self  Labs CBC Latest Ref Rng 06/18/2015 05/28/2015 04/17/2015  WBC 4.0 - 10.5 K/uL 7.9 8.8 9.6  Hemoglobin 12.0 - 15.0 g/dL 10.9(L) 10.5(A) 10.8(A)  Hematocrit 36.0 - 46.0 % 34.7(L) 35(A) 34(A)  Platelets 150 - 400 K/uL 237 214 210   CMP Latest Ref Rng 06/18/2015 05/28/2015 04/17/2015  Glucose 65 - 99 mg/dL 562(Z) - -  BUN 6 - 20 mg/dL 15 30(Q) 20  Creatinine 0.44 - 1.00 mg/dL 6.57 0.8 0.9  Sodium 846 - 145 mmol/L 137 139 147  Potassium 3.5 - 5.1 mmol/L 4.0 4.1 3.7  Chloride 101 - 111 mmol/L 106 - -  CO2 22 - 32 mmol/L 24 - -  Calcium 8.9 - 10.3 mg/dL 8.3(L) - -  Total Protein 6.0 - 8.3 g/dL - - -  Total Bilirubin 0.3 - 1.2 mg/dL - - -  Alkaline Phos 25 - 125 U/L - 96 89  AST 13 - 35 U/L - 12(A) 19  ALT 7 - 35 U/L - 13 12   Lab Results  Component Value Date   HGBA1C 7.8 05/28/2015   Lipid Panel     Component Value Date/Time   CHOL 146 05/28/2015   TRIG 141 05/28/2015   HDL 43 05/28/2015   CHOLHDL 4.7 09/19/2008 1845   VLDL 57* 09/19/2008 1845   LDLCALC 74 05/28/2015   08/25/14 microalbuminuria present   ASSESSMENT/PLAN  Type 2 DM with vascular disease  Lab Results  Component Value Date   HGBA1C 7.8 05/28/2015   Monitor cbg. Continue lantus 35 u bid. Continue wound care to her foot  Diabetic nephropathy Continue benazepril and  monitor  Stage 2 pressure ulcer Continue wound care and follow with treatment nurse  Cellulitis of right toe Completed antibiotic and has made clinical improvement, monitor    Oneal Grout, MD Internal Medicine [facility]  Medical Group [place], [place] [address] Cell Phone (Monday-Friday 8 am - 5 pm): [phone] On Call: [phone] and follow prompts after 5 pm and on weekends Office Phone: [phone] Office Fax: [phone]

## 2015-08-19 DIAGNOSIS — E1121 Type 2 diabetes mellitus with diabetic nephropathy: Secondary | ICD-10-CM | POA: Insufficient documentation

## 2015-08-19 DIAGNOSIS — L8992 Pressure ulcer of unspecified site, stage 2: Secondary | ICD-10-CM | POA: Insufficient documentation

## 2015-08-19 DIAGNOSIS — R809 Proteinuria, unspecified: Secondary | ICD-10-CM

## 2015-08-19 DIAGNOSIS — E1129 Type 2 diabetes mellitus with other diabetic kidney complication: Secondary | ICD-10-CM | POA: Insufficient documentation

## 2015-08-19 DIAGNOSIS — Z794 Long term (current) use of insulin: Secondary | ICD-10-CM

## 2015-09-22 ENCOUNTER — Encounter: Payer: Self-pay | Admitting: Internal Medicine

## 2015-09-22 ENCOUNTER — Non-Acute Institutional Stay (SKILLED_NURSING_FACILITY): Payer: Medicare Other | Admitting: Internal Medicine

## 2015-09-22 DIAGNOSIS — E1159 Type 2 diabetes mellitus with other circulatory complications: Secondary | ICD-10-CM

## 2015-09-22 DIAGNOSIS — G308 Other Alzheimer's disease: Secondary | ICD-10-CM

## 2015-09-22 DIAGNOSIS — F0280 Dementia in other diseases classified elsewhere without behavioral disturbance: Secondary | ICD-10-CM | POA: Diagnosis not present

## 2015-09-22 DIAGNOSIS — I10 Essential (primary) hypertension: Secondary | ICD-10-CM | POA: Diagnosis not present

## 2015-09-22 NOTE — Progress Notes (Signed)
Patient ID: [name], female   DOB: 1929-10-01, 82 y.o.   MRN: [id number]      [facility]   Code Status:  Full Code   Allergies  Allergen Reactions  . Rocephin [Ceftriaxone Sodium In Dextrose]     [facility]      Chief Complaint  Patient presents with  . Medical Management of Chronic Issues    Routine Visit   Advanced Directives 06/18/2015  Does patient have an advance directive? No    HPI 82 y/o female patient is seen for routine visit. She has developed Pressure ulcer and is being followed by treatment nurse. She has been at her baseline otherwise. No new concern from her nurse. No fall reported. She feeds herself.    ROS: limited with her dementia Constitutional: Negative for fever HENT: Negative for congestion.   Respiratory: Negative for cough, shortness of breath and wheezing.   Cardiovascular: Negative for chest pain Gastrointestinal: Negative for heartburn, nausea, vomiting, abdominal pain.     Past medical history reviewed  Medication reviewed. [facility]   Medication List       Accurate as of 09/22/15  2:26 PM. Always use your most recent med list.          AMBULATORY NON FORMULARY MEDICATION Medication Name: Med Pass 90 mL twice daily for nutritional supplement   atorvastatin 10 MG tablet Commonly known as:  LIPITOR Take 10 mg by mouth daily.   benazepril 5 MG tablet Commonly known as:  LOTENSIN Take 5 mg by mouth daily.   cholecalciferol 1000 units tablet Commonly known as:  VITAMIN D Take 1,000 Units by mouth daily. For Senile Osteoporosis   clonazePAM 0.5 MG tablet Commonly known as:  KLONOPIN Take 0.25 mg by mouth 2 (two) times daily.   DECUBI-VITE Caps Take 1 capsule by mouth daily.   donepezil 10 MG tablet Commonly known as:  ARICEPT Take 10 mg by mouth at bedtime.   insulin glargine 100 UNIT/ML injection Commonly known as:  LANTUS Inject 28-35 Units into the skin 2 (two) times  daily. Inject 35 units subcutaneously every morning and inject 28 units subcutaneously every evening to control blood sugar   INVEGA SUSTENNA 117 MG/0.75ML Susp Generic drug:  Paliperidone Palmitate Inject 0.5175ml intramuscularly once a month on the 4th for schizaffective disorder   Lavender Oil Oil Apply 1 drop topically at bedtime as needed (for relaxation). Apply 1 drop behind each ear every night at bedtime as needed for relaxation   memantine 28 MG Cp24 24 hr capsule Commonly known as:  NAMENDA XR Take 28 mg by mouth daily.   mirtazapine 15 MG tablet Commonly known as:  REMERON Take 15 mg by mouth daily.   ondansetron 4 MG disintegrating tablet Commonly known as:  ZOFRAN ODT Take 1 tablet (4 mg total) by mouth every 12 (twelve) hours as needed for nausea or vomiting.   OYST-CAL 500 MG Tabs Generic drug:  Oyster Shell Calcium Take 1 tablet by mouth 2 (two) times daily.   primidone 50 MG tablet Commonly known as:  MYSOLINE Take 50 mg by mouth 3 (three) times daily.   SYSTANE FREE OP Place 1 drop into both eyes 3 (three) times daily.   UNABLE TO FIND Med Name: Magic Cup daily at 2 pm with Medpass   UTI-STAT Liqd Take 30 mLs by mouth every morning.   zinc oxide 20 % ointment Apply 1 application topically daily as needed  for irritation.       Physical exam BP 107/74   Pulse 85   Temp 98.1 F (36.7 C) (Oral)   Resp 20   Ht 5\' 1"  (1.549 m)   Wt 133 lb 3.2 oz (60.4 kg)   SpO2 95%   BMI 25.17 kg/m   Wt Readings from Last 3 Encounters:  09/22/15 133 lb 3.2 oz (60.4 kg)  08/18/15 133 lb 12.8 oz (60.7 kg)  07/03/15 133 lb 12.8 oz (60.7 kg)   General- elderly female, in no acute distress Head- atraumatic, normocephalic Eyes- no pallor, no icterus, no discharge Neck- no lymphadenopathy Mouth- normal mucus membrane, edentulous Cardiovascular- normal s1,s2, systolic murmur +, leg edema +, diminished pedal pulses Respiratory- bilateral clear to auscultation, no  wheeze, no rhonchi, no crackles Abdomen- bowel sounds present, soft, non tender Musculoskeletal- able to move all 4 extremities, no contractures, non ambulatory, modified shoe for shortened LLE, able to propel herself in wheelchair for short distances, hoyer lift transfers Neurological- one word answer sometimes otherwise incoherent speech Skin- warm and dry, left lateral heel stage 2 pressure ulcer and has prevalon boots, SDTI to right great toe and stage 2 fluid filled blister to right lateral foot Psychiatry- pleasantly confused, oriented to self  Labs CBC Latest Ref Rng & Units 06/18/2015 05/28/2015 04/17/2015  WBC 4.0 - 10.5 K/uL 7.9 8.8 9.6  Hemoglobin 12.0 - 15.0 g/dL 10.9(L) 10.5(A) 10.8(A)  Hematocrit 36.0 - 46.0 % 34.7(L) 35(A) 34(A)  Platelets 150 - 400 K/uL 237 214 210   CMP Latest Ref Rng & Units 06/18/2015 05/28/2015 04/17/2015  Glucose 65 - 99 mg/dL 161(W330(H) - -  BUN 6 - 20 mg/dL 15 96(E24(A) 20  Creatinine 0.44 - 1.00 mg/dL 4.540.88 0.8 0.9  Sodium 098135 - 145 mmol/L 137 139 147  Potassium 3.5 - 5.1 mmol/L 4.0 4.1 3.7  Chloride 101 - 111 mmol/L 106 - -  CO2 22 - 32 mmol/L 24 - -  Calcium 8.9 - 10.3 mg/dL 8.3(L) - -  Total Protein 6.0 - 8.3 g/dL - - -  Total Bilirubin 0.3 - 1.2 mg/dL - - -  Alkaline Phos 25 - 125 U/L - 96 89  AST 13 - 35 U/L - 12(A) 19  ALT 7 - 35 U/L - 13 12   Lab Results  Component Value Date   HGBA1C 7.8 05/28/2015   Lipid Panel     Component Value Date/Time   CHOL 146 05/28/2015   TRIG 141 05/28/2015   HDL 43 05/28/2015   CHOLHDL 4.7 09/19/2008 1845   VLDL 57 (H) 09/19/2008 1845   LDLCALC 74 05/28/2015   08/25/14 microalbuminuria present   ASSESSMENT/PLAN  Hypertension Monitor bp, continue benazepril 5 mg daily. Check bmp BMP Latest Ref Rng & Units 06/18/2015 05/28/2015 04/17/2015  Glucose 65 - 99 mg/dL 119(J330(H) - -  BUN 6 - 20 mg/dL 15 47(W24(A) 20  Creatinine 0.44 - 1.00 mg/dL 2.950.88 0.8 0.9  Sodium 621135 - 145 mmol/L 137 139 147  Potassium 3.5 - 5.1  mmol/L 4.0 4.1 3.7  Chloride 101 - 111 mmol/L 106 - -  CO2 22 - 32 mmol/L 24 - -  Calcium 8.9 - 10.3 mg/dL 8.3(L) - -    Alzheimer's Dementia without behavioral disturbance Continue supportive care. Continue namenda xr 28 mg daily and aricept   Type 2 DM with vascular disease  Lab Results  Component Value Date   HGBA1C 7.8 05/28/2015   Check a1c. Monitor cbg. Continue lantus 35 u  in am and 28 u in pm. Continue treatment to pressure ulcer and monitor for signs of infection. continue statin    Oneal Grout, MD Internal Medicine [facility] [address] [place], [place] [address] Cell Phone (Monday-Friday 8 am - 5 pm): [phone] On Call: [phone] and follow prompts after 5 pm and on weekends Office Phone: [phone] Office Fax: [phone]

## 2015-09-23 LAB — BASIC METABOLIC PANEL
BUN: 23 mg/dL — AB (ref 4–21)
Creatinine: 0.9 mg/dL (ref 0.5–1.1)
Potassium: 4.1 mmol/L (ref 3.4–5.3)
Sodium: 143 mmol/L (ref 137–147)

## 2015-09-23 LAB — HEMOGLOBIN A1C: HEMOGLOBIN A1C: 8

## 2015-10-29 ENCOUNTER — Encounter: Payer: Self-pay | Admitting: Internal Medicine

## 2015-10-29 ENCOUNTER — Non-Acute Institutional Stay (SKILLED_NURSING_FACILITY): Payer: Medicare Other | Admitting: Internal Medicine

## 2015-10-29 DIAGNOSIS — M818 Other osteoporosis without current pathological fracture: Secondary | ICD-10-CM | POA: Diagnosis not present

## 2015-10-29 DIAGNOSIS — H04123 Dry eye syndrome of bilateral lacrimal glands: Secondary | ICD-10-CM | POA: Diagnosis not present

## 2015-10-29 DIAGNOSIS — G250 Essential tremor: Secondary | ICD-10-CM

## 2015-10-29 DIAGNOSIS — M810 Age-related osteoporosis without current pathological fracture: Secondary | ICD-10-CM

## 2015-10-29 NOTE — Progress Notes (Signed)
Patient ID: Marisa Scott, female   DOB: [birth year], 82 y.o.   MRN: [id number]      [facility]   Code Status:  Full Code   Allergies  Allergen Reactions  . Rocephin [Ceftriaxone Sodium In Dextrose]     Unknown reaction per [facility]      Chief Complaint  Patient presents with  . Medical Management of Chronic Issues    Routine Visit   Advanced Directives 06/18/2015  Does patient have an advance directive? No    HPI 82 y/o female patient is seen for routine visit. She denies any concern. does not participate in HPI ad ROS today. She has been at her baseline otherwise per nursing. No new concern from her nurse. No fall reported. She feeds herself. Her stage 2 pressure ulcer has resolved   ROS: limited with her dementia Constitutional: Negative for fever Respiratory: Negative for cough, shortness of breathCardiovascular: Negative for chest pain Gastrointestinal: Negative for heartburn, nausea, vomiting, abdominal pain.     Past medical history reviewed   Medication reviewed. See [facility]   Medication List       Accurate as of 10/29/15  2:26 PM. Always use your most recent med list.          AMBULATORY NON FORMULARY MEDICATION Medication Name: Med Pass 90 mL twice daily for nutritional supplement   atorvastatin 10 MG tablet Commonly known as:  LIPITOR Take 10 mg by mouth daily.   benazepril 5 MG tablet Commonly known as:  LOTENSIN Take 5 mg by mouth daily.   cholecalciferol 1000 units tablet Commonly known as:  VITAMIN D Take 1,000 Units by mouth daily. For Senile Osteoporosis   clonazePAM 0.5 MG tablet Commonly known as:  KLONOPIN Take 0.25 mg by mouth 2 (two) times daily.   DECUBI-VITE Caps Take 1 capsule by mouth daily.   donepezil 10 MG tablet Commonly known as:  ARICEPT Take 10 mg by mouth at bedtime.   insulin glargine 100 UNIT/ML injection Commonly known as:  LANTUS Inject 28-35 Units into the skin 2 (two) times daily. Inject  35 units subcutaneously every morning and inject 28 units subcutaneously every evening to control blood sugar   INVEGA SUSTENNA 117 MG/0.75ML Susp Generic drug:  Paliperidone Palmitate Inject 0.44ml intramuscularly once a month on the 4th for schizaffective disorder   Lavender Oil Oil Apply 1 drop topically at bedtime as needed (for relaxation). Apply 1 drop behind each ear every night at bedtime as needed for relaxation   memantine 28 MG Cp24 24 hr capsule Commonly known as:  NAMENDA XR Take 28 mg by mouth daily.   mirtazapine 15 MG tablet Commonly known as:  REMERON Take 15 mg by mouth daily.   ondansetron 4 MG disintegrating tablet Commonly known as:  ZOFRAN ODT Take 1 tablet (4 mg total) by mouth every 12 (twelve) hours as needed for nausea or vomiting.   OYST-CAL 500 MG Tabs Generic drug:  Oyster Shell Calcium Take 1 tablet by mouth 2 (two) times daily.   primidone 50 MG tablet Commonly known as:  MYSOLINE Take 50 mg by mouth 3 (three) times daily.   SYSTANE FREE OP Place 1 drop into both eyes 3 (three) times daily.   UNABLE TO FIND Med Name: Magic Cup daily at 2 pm with Medpass   UTI-STAT Liqd Take 30 mLs by mouth every morning.   zinc oxide 20 % ointment Apply 1 application topically daily as needed for irritation.  Physical exam BP 114/65   Pulse 88   Temp 98.1 F (36.7 C) (Oral)   Resp 20   Ht 5\' 1"  (1.549 m)   Wt 134 lb 12.8 oz (61.1 kg)   SpO2 97%   BMI 25.47 kg/m   Wt Readings from Last 3 Encounters:  10/29/15 134 lb 12.8 oz (61.1 kg)  09/22/15 133 lb 3.2 oz (60.4 kg)  08/18/15 133 lb 12.8 oz (60.7 kg)   General- elderly female, in no acute distress Head- atraumatic, normocephalic Eyes- no pallor, no icterus, no discharge Neck- no lymphadenopathy Mouth- normal mucus membrane, edentulous Cardiovascular- normal s1,s2, systolic murmur +, leg edema +, diminished pedal pulses Respiratory- bilateral clear to auscultation, no wheeze, no  rhonchi, no crackles Abdomen- bowel sounds present, soft, non tender Musculoskeletal- able to move all 4 extremities, no contractures, non ambulatory, modified shoe for shortened LLE, able to propel herself in wheelchair for short distances, hoyer lift transfers Neurological- one word answer sometimes otherwise incoherent speech Skin- warm and dry, left heel pressure ulcer has resolved Psychiatry- pleasantly confused, oriented to self   Labs CBC Latest Ref Rng & Units 06/18/2015 05/28/2015 04/17/2015  WBC 4.0 - 10.5 K/uL 7.9 8.8 9.6  Hemoglobin 12.0 - 15.0 g/dL 10.9(L) 10.5(A) 10.8(A)  Hematocrit 36.0 - 46.0 % 34.7(L) 35(A) 34(A)  Platelets 150 - 400 K/uL 237 214 210   CMP Latest Ref Rng & Units 09/23/2015 06/18/2015 05/28/2015  Glucose 65 - 99 mg/dL - 295(A330(H) -  BUN 4 - 21 mg/dL 21(H23(A) 15 08(M24(A)  Creatinine 0.5 - 1.1 mg/dL 0.9 5.780.88 0.8  Sodium 469137 - 147 mmol/L 143 137 139  Potassium 3.4 - 5.3 mmol/L 4.1 4.0 4.1  Chloride 101 - 111 mmol/L - 106 -  CO2 22 - 32 mmol/L - 24 -  Calcium 8.9 - 10.3 mg/dL - 8.3(L) -  Total Protein 6.0 - 8.3 g/dL - - -  Total Bilirubin 0.3 - 1.2 mg/dL - - -  Alkaline Phos 25 - 125 U/L - - 96  AST 13 - 35 U/L - - 12(A)  ALT 7 - 35 U/L - - 13   Lab Results  Component Value Date   HGBA1C 8.0 09/23/2015   Lipid Panel     Component Value Date/Time   CHOL 146 05/28/2015   TRIG 141 05/28/2015   HDL 43 05/28/2015   CHOLHDL 4.7 09/19/2008 1845   VLDL 57 (H) 09/19/2008 1845   LDLCALC 74 05/28/2015   08/25/14 microalbuminuria present   ASSESSMENT/PLAN  Osteoporosis Age related. Continue calcium-vitamin d supplement  Essential tremor With her psychiatric med induced side effect. Continue mysoline  Dry eye syndrome Chronic, stable and toelrating eye drops. Continue systane eye drops    Oneal GroutMAHIMA Kayleen Alig, MD Internal Medicine [facility] [address] [place], [place] [address] Cell Phone (Monday-Friday 8 am - 5 pm):  [phone] On Call: [phone] and follow prompts after 5 pm and on weekends Office Phone: [phone] Office Fax: [phone]

## 2015-11-18 LAB — HEPATIC FUNCTION PANEL
ALK PHOS: 102 U/L (ref 25–125)
ALT: 14 U/L (ref 7–35)
AST: 14 U/L (ref 13–35)
Bilirubin, Total: 0.3 mg/dL

## 2015-11-18 LAB — BASIC METABOLIC PANEL
BUN: 17 mg/dL (ref 4–21)
Creatinine: 0.7 mg/dL (ref 0.5–1.1)
GLUCOSE: 187 mg/dL
Potassium: 4.1 mmol/L (ref 3.4–5.3)
Sodium: 139 mmol/L (ref 137–147)

## 2015-11-18 LAB — LIPID PANEL
Cholesterol: 173 mg/dL (ref 0–200)
HDL: 46 mg/dL (ref 35–70)
LDL CALC: 88 mg/dL
Triglycerides: 198 mg/dL — AB (ref 40–160)

## 2015-11-18 LAB — TSH: TSH: 3.2 u[IU]/mL (ref 0.41–5.90)

## 2015-11-25 ENCOUNTER — Encounter: Payer: Self-pay | Admitting: Internal Medicine

## 2015-11-25 ENCOUNTER — Non-Acute Institutional Stay (SKILLED_NURSING_FACILITY): Payer: Medicare Other | Admitting: Internal Medicine

## 2015-11-25 DIAGNOSIS — G309 Alzheimer's disease, unspecified: Secondary | ICD-10-CM

## 2015-11-25 DIAGNOSIS — E782 Mixed hyperlipidemia: Secondary | ICD-10-CM | POA: Diagnosis not present

## 2015-11-25 DIAGNOSIS — M810 Age-related osteoporosis without current pathological fracture: Secondary | ICD-10-CM

## 2015-11-25 DIAGNOSIS — Z0000 Encounter for general adult medical examination without abnormal findings: Secondary | ICD-10-CM

## 2015-11-25 DIAGNOSIS — F329 Major depressive disorder, single episode, unspecified: Secondary | ICD-10-CM

## 2015-11-25 DIAGNOSIS — D692 Other nonthrombocytopenic purpura: Secondary | ICD-10-CM | POA: Diagnosis not present

## 2015-11-25 DIAGNOSIS — G250 Essential tremor: Secondary | ICD-10-CM

## 2015-11-25 DIAGNOSIS — E1129 Type 2 diabetes mellitus with other diabetic kidney complication: Secondary | ICD-10-CM | POA: Diagnosis not present

## 2015-11-25 DIAGNOSIS — F0280 Dementia in other diseases classified elsewhere without behavioral disturbance: Secondary | ICD-10-CM

## 2015-11-25 DIAGNOSIS — N390 Urinary tract infection, site not specified: Secondary | ICD-10-CM

## 2015-11-25 DIAGNOSIS — I10 Essential (primary) hypertension: Secondary | ICD-10-CM

## 2015-11-25 DIAGNOSIS — F0150 Vascular dementia without behavioral disturbance: Secondary | ICD-10-CM | POA: Diagnosis not present

## 2015-11-25 DIAGNOSIS — Z794 Long term (current) use of insulin: Secondary | ICD-10-CM

## 2015-11-25 DIAGNOSIS — R809 Proteinuria, unspecified: Secondary | ICD-10-CM | POA: Diagnosis not present

## 2015-11-25 NOTE — Progress Notes (Signed)
Patient ID: Marisa Scott, female   DOB: July 29, 1929, 82 y.o.   MRN: [id number]      [facility]   Code Status:  Full Code   Allergies  Allergen Reactions  . Rocephin [Ceftriaxone Sodium In Dextrose]     [facility]      Chief Complaint  Patient presents with  . Annual Exam    Annual Visit   Advanced Directives 06/18/2015  Does patient have an advance directive? No    HPI 82 y/o female patient is seen for annual visit. She has limited participation in HPI and ROS. She has dementia. She is out of bed every other day. She feeds herself. She is on wheelchair and has to be wheeled around. She has been at her baseline per nursing. No fall reported. Her pressure ulcers have healed.    ROS: limited with her dementia Constitutional: Negative for fever Respiratory: Negative for cough, shortness of breath Cardiovascular: Negative for chest pain Gastrointestinal: Negative for nausea, vomiting, abdominal pain.     Past medical history reviewed   Medication reviewed. [facility]   Medication List       Accurate as of 11/25/15 12:53 PM. Always use your most recent med list.          AMBULATORY NON FORMULARY MEDICATION Medication Name: Med Pass 90 mL twice daily for nutritional supplement   atorvastatin 10 MG tablet Commonly known as:  LIPITOR Take 10 mg by mouth daily.   benazepril 5 MG tablet Commonly known as:  LOTENSIN Take 5 mg by mouth daily.   cholecalciferol 1000 units tablet Commonly known as:  VITAMIN D Take 1,000 Units by mouth daily. For Senile Osteoporosis   clonazePAM 0.5 MG tablet Commonly known as:  KLONOPIN Take 0.25 mg by mouth 2 (two) times daily.   DECUBI-VITE Caps Take 1 capsule by mouth daily.   donepezil 10 MG tablet Commonly known as:  ARICEPT Take 10 mg by mouth at bedtime.   insulin glargine 100 UNIT/ML injection Commonly known as:  LANTUS Inject 28-35 Units into the skin 2 (two) times daily.  Inject 35 units subcutaneously every morning and inject 28 units subcutaneously every evening to control blood sugar   INVEGA SUSTENNA 117 MG/0.75ML Susp Generic drug:  Paliperidone Palmitate Inject 0.2075ml intramuscularly once a month on the 4th for schizaffective disorder   Lavender Oil Oil Apply 1 drop topically at bedtime as needed (for relaxation). Apply 1 drop behind each ear every night at bedtime as needed for relaxation   memantine 28 MG Cp24 24 hr capsule Commonly known as:  NAMENDA XR Take 28 mg by mouth daily.   mirtazapine 15 MG tablet Commonly known as:  REMERON Take 15 mg by mouth daily.   ondansetron 4 MG disintegrating tablet Commonly known as:  ZOFRAN ODT Take 1 tablet (4 mg total) by mouth every 12 (twelve) hours as needed for nausea or vomiting.   OYST-CAL 500 MG Tabs Generic drug:  Oyster Shell Calcium Take 1 tablet by mouth 2 (two) times daily.   primidone 50 MG tablet Commonly known as:  MYSOLINE Take 50 mg by mouth 3 (three) times daily.   SYSTANE FREE OP Place 1 drop into both eyes 3 (three) times daily.   UNABLE TO FIND Med Name: Magic Cup daily at 2 pm with Medpass   UTI-STAT Liqd Take 30 mLs by mouth every morning.   zinc oxide 20 % ointment Apply 1 application topically  daily as needed for irritation.       Physical exam BP 122/70   Pulse 100   Temp (!) 96.6 F (35.9 C) (Oral)   Resp 20   Ht 5\' 1"  (1.549 m)   Wt 134 lb 9.6 oz (61.1 kg)   SpO2 95%   BMI 25.43 kg/m   Wt Readings from Last 3 Encounters:  11/25/15 134 lb 9.6 oz (61.1 kg)  10/29/15 134 lb 12.8 oz (61.1 kg)  09/22/15 133 lb 3.2 oz (60.4 kg)   General- elderly female, well built, in no acute distress Head- atraumatic, normocephalic Eyes- no pallor, no icterus, no discharge Neck- no lymphadenopathy Mouth- normal mucus membrane, edentulous Cardiovascular- normal s1,s2, systolic murmur +, leg edema +, diminished pedal pulses Respiratory- bilateral clear to  auscultation, no wheeze, no rhonchi, no crackles Abdomen- bowel sounds present, soft, non tender Musculoskeletal- able to move all 4 extremities, no contractures, non ambulatory, modified shoe for shortened LLE, able to propel herself in wheelchair for short distances, hoyer lift transfers, muscular atrophy throughout Neurological- one word answer sometimes otherwise incoherent speech Skin- warm and dry Psychiatry- pleasantly confused, oriented to self   Labs CBC Latest Ref Rng & Units 06/18/2015 05/28/2015 04/17/2015  WBC 4.0 - 10.5 K/uL 7.9 8.8 9.6  Hemoglobin 12.0 - 15.0 g/dL 10.9(L) 10.5(A) 10.8(A)  Hematocrit 36.0 - 46.0 % 34.7(L) 35(A) 34(A)  Platelets 150 - 400 K/uL 237 214 210   CMP Latest Ref Rng & Units 11/18/2015 09/23/2015 06/18/2015  Glucose 65 - 99 mg/dL - - 454(U)  BUN 4 - 21 mg/dL 17 98(J) 15  Creatinine 0.5 - 1.1 mg/dL 0.7 0.9 1.91  Sodium 478 - 147 mmol/L 139 143 137  Potassium 3.4 - 5.3 mmol/L 4.1 4.1 4.0  Chloride 101 - 111 mmol/L - - 106  CO2 22 - 32 mmol/L - - 24  Calcium 8.9 - 10.3 mg/dL - - 8.3(L)  Total Protein 6.0 - 8.3 g/dL - - -  Total Bilirubin 0.3 - 1.2 mg/dL - - -  Alkaline Phos 25 - 125 U/L 102 - -  AST 13 - 35 U/L 14 - -  ALT 7 - 35 U/L 14 - -   Lab Results  Component Value Date   HGBA1C 8.0 09/23/2015   Lipid Panel     Component Value Date/Time   CHOL 173 11/18/2015   TRIG 198 (A) 11/18/2015   HDL 46 11/18/2015   CHOLHDL 4.7 09/19/2008 1845   VLDL 57 (H) 09/19/2008 1845   LDLCALC 88 11/18/2015   08/25/14 microalbuminuria present 06/16/15 diabetic foot exam   ASSESSMENT/PLAN  Hypertension Stable BP on review. Continue benazepril 5 mg daily  Annual exam Will received her influenza vaccine. Labs reviewed. On lisinopril for renal protection. Reviewed medications. Patient here for long term care and receives supportive care. Due for diabetic eye exam  Recurrent UTI No UTI recently. Continue cranberry supplement. Hydration to be  maintained.   Essential tremor Continue primidone 50 mg tid and monitor  Non thrombocytopenic purpura To provide skin care. No skin tear and pressure ulcer at present.   Osteoporosis Age related. Continue calcium-vitamin d supplement. Fall precautions  Dementia without behavioral disturbance Has mixed alzheimer's and vascular dementia. Continue namenda xr and aricept  Hyperlipidemia Lipid Panel     Component Value Date/Time   CHOL 173 11/18/2015   TRIG 198 (A) 11/18/2015   HDL 46 11/18/2015   CHOLHDL 4.7 09/19/2008 1845   VLDL 57 (H) 09/19/2008 1845  LDLCALC 88 11/18/2015   LDL at goal but has elevated TG. Continue atorvastatin 10 mg daily  Dm type 2 with microalbuminuria Continue lantus 35 u in am and 28 u in pm. Monitor cbg. Continue statin. Will need her eye exam. Lab Results  Component Value Date   HGBA1C 8.0 09/23/2015    Chronic depression Currently on remeron 15 mg daily. Followed by psych services, monitor    Oneal Grout, MD Internal Medicine [facility] [address] [place], [place] [address] Cell Phone (Monday-Friday 8 am - 5 pm): [phone] On Call: [phone] and follow prompts after 5 pm and on weekends Office Phone: [phone] Office Fax: [phone]

## 2016-01-07 LAB — HEMOGLOBIN A1C: Hemoglobin A1C: 9.7

## 2016-01-15 ENCOUNTER — Encounter: Payer: Self-pay | Admitting: Internal Medicine

## 2016-01-15 ENCOUNTER — Non-Acute Institutional Stay (SKILLED_NURSING_FACILITY): Payer: Medicare Other | Admitting: Internal Medicine

## 2016-01-15 DIAGNOSIS — R451 Restlessness and agitation: Secondary | ICD-10-CM

## 2016-01-15 DIAGNOSIS — R1310 Dysphagia, unspecified: Secondary | ICD-10-CM

## 2016-01-15 DIAGNOSIS — R809 Proteinuria, unspecified: Secondary | ICD-10-CM | POA: Diagnosis not present

## 2016-01-15 DIAGNOSIS — E1129 Type 2 diabetes mellitus with other diabetic kidney complication: Secondary | ICD-10-CM | POA: Diagnosis not present

## 2016-01-15 DIAGNOSIS — Z794 Long term (current) use of insulin: Secondary | ICD-10-CM | POA: Diagnosis not present

## 2016-01-15 DIAGNOSIS — I10 Essential (primary) hypertension: Secondary | ICD-10-CM

## 2016-01-15 DIAGNOSIS — Z9189 Other specified personal risk factors, not elsewhere classified: Secondary | ICD-10-CM

## 2016-01-15 NOTE — Progress Notes (Signed)
Patient ID: Marisa Scott, female   DOB: [birth year], 82 y.o.   MRN: [id number]      [facility] - [facility]   Code Status:  Full Code   Allergies  Allergen Reactions  . Rocephin [Ceftriaxone Sodium In Dextrose]     Unknown reaction per [facility]      Chief Complaint  Patient presents with  . Medical Management of Chronic Issues    Routine Visit   Advanced Directives 06/18/2015  Does Patient Have a Medical Advance Directive? No    HPI 82 y/o female patient is seen for routine visit. She has been agitated and has been refusing her medications per nursing. On chart review her blood sugar readings have been elevated. Her Lantus has been increased from 28 units to 38 units in the evening on 01/11/2016. She is pending a swallow study. She does not participate in history of present illness or review of systems today. No fall reported per nursing. No new skin concerns.   [id number] to obtain.    Past medical history reviewed   Medication reviewed. See MAR Allergies as of 01/15/2016      Reactions   Rocephin [ceftriaxone Sodium In Dextrose]    Unknown reaction per [facility]       Medication List       Accurate as of 01/15/16 11:12 AM. Always use your most recent med list.          acetaminophen 325 MG tablet Commonly known as:  TYLENOL Take 650 mg by mouth every 4 (four) hours as needed for mild pain.   AMBULATORY NON FORMULARY MEDICATION Medication Name: Med Pass 90 mL twice daily for nutritional supplement   atorvastatin 10 MG tablet Commonly known as:  LIPITOR Take 10 mg by mouth daily.   benazepril 5 MG tablet Commonly known as:  LOTENSIN Take 5 mg by mouth daily.   cholecalciferol 1000 units tablet Commonly known as:  VITAMIN D Take 1,000 Units by mouth daily. For Senile Osteoporosis   clonazePAM 0.5 MG tablet Commonly known as:  KLONOPIN Take 0.25 mg by mouth 2 (two) times daily.   DECUBI-VITE Caps Take 1 capsule by mouth daily.   donepezil 10  MG tablet Commonly known as:  ARICEPT Take 10 mg by mouth at bedtime.   insulin glargine 100 UNIT/ML injection Commonly known as:  LANTUS Inject 32-35 Units into the skin 2 (two) times daily. Inject 35 units subcutaneously every morning and inject 32 units subcutaneously every evening to control blood sugar   INVEGA SUSTENNA 117 MG/0.75ML Susp Generic drug:  Paliperidone Palmitate Inject 0.64ml intramuscularly once a month on the 4th for schizaffective disorder   Lavender Oil Oil Apply 1 drop topically at bedtime as needed (for relaxation). Apply 1 drop behind each ear every night at bedtime as needed for relaxation   memantine 28 MG Cp24 24 hr capsule Commonly known as:  NAMENDA XR Take 28 mg by mouth daily.   mirtazapine 15 MG tablet Commonly known as:  REMERON Take 15 mg by mouth daily.   ondansetron 4 MG disintegrating tablet Commonly known as:  ZOFRAN ODT Take 1 tablet (4 mg total) by mouth every 12 (twelve) hours as needed for nausea or vomiting.   OYST-CAL 500 MG Tabs Generic drug:  Oyster Shell Calcium Take 1 tablet by mouth 2 (two) times daily.   primidone 50 MG tablet Commonly known as:  MYSOLINE Take 50 mg by mouth 3 (three) times daily.   SYSTANE  FREE OP Place 1 drop into both eyes 3 (three) times daily.   UNABLE TO FIND Med Name: Magic Cup daily at 2 pm with Medpass   UTI-STAT Liqd Take 30 mLs by mouth every morning.   zinc oxide 20 % ointment Apply 1 application topically daily as needed for irritation.       Physical exam BP (!) 153/68   Pulse (!) 103   Temp 97.5 F (36.4 C) (Oral)   Resp 20   Ht 5\' 1"  (1.549 m)   Wt 136 lb 3.2 oz (61.8 kg)   SpO2 98%   BMI 25.73 kg/m   Wt Readings from Last 3 Encounters:  01/15/16 136 lb 3.2 oz (61.8 kg)  11/25/15 134 lb 9.6 oz (61.1 kg)  10/29/15 134 lb 12.8 oz (61.1 kg)   General- elderly female, in no acute distress Head- atraumatic, normocephalic Eyes- no pallor, no icterus, no discharge Neck-  no lymphadenopathy Mouth- normal mucus membrane, edentulous Cardiovascular- normal s1,s2, systolic murmur +, leg edema +, diminished pedal pulses Respiratory- bilateral clear to auscultation, no wheeze, no rhonchi, no crackles Abdomen- bowel sounds present, soft, non tender Musculoskeletal- able to move all 4 extremities, no contractures, non ambulatory, modified shoe for shortened LLE, able to propel herself in wheelchair for short distances, hoyer lift transfers Neurological- one word answer at times, mostly non verbal Skin- warm and dry Psychiatry- pleasantly confused, oriented to self   Labs CBC Latest Ref Rng & Units 06/18/2015 05/28/2015 04/17/2015  WBC 4.0 - 10.5 K/uL 7.9 8.8 9.6  Hemoglobin 12.0 - 15.0 g/dL 10.9(L) 10.5(A) 10.8(A)  Hematocrit 36.0 - 46.0 % 34.7(L) 35(A) 34(A)  Platelets 150 - 400 K/uL 237 214 210   CMP Latest Ref Rng & Units 11/18/2015 09/23/2015 06/18/2015  Glucose 65 - 99 mg/dL - - 604(V330(H)  BUN 4 - 21 mg/dL 17 40(J23(A) 15  Creatinine 0.5 - 1.1 mg/dL 0.7 0.9 8.110.88  Sodium 914137 - 147 mmol/L 139 143 137  Potassium 3.4 - 5.3 mmol/L 4.1 4.1 4.0  Chloride 101 - 111 mmol/L - - 106  CO2 22 - 32 mmol/L - - 24  Calcium 8.9 - 10.3 mg/dL - - 8.3(L)  Total Protein 6.0 - 8.3 g/dL - - -  Total Bilirubin 0.3 - 1.2 mg/dL - - -  Alkaline Phos 25 - 125 U/L 102 - -  AST 13 - 35 U/L 14 - -  ALT 7 - 35 U/L 14 - -   Lab Results  Component Value Date   HGBA1C 9.7 01/07/2016   Lipid Panel     Component Value Date/Time   CHOL 173 11/18/2015   TRIG 198 (A) 11/18/2015   HDL 46 11/18/2015   CHOLHDL 4.7 09/19/2008 1845   VLDL 57 (H) 09/19/2008 1845   LDLCALC 88 11/18/2015   08/25/14 microalbuminuria present   ASSESSMENT/PLAN  Diabetes mellitus type 2 with nephropathy Reviewed blood sugar reading. Currently on Lantus 35 units in the a.m. and 32 units in p.m.- recent change made. Currently on blood sugar checks weekly. Change to blood sugar check twice a day for now. Reviewed  hemoglobin A1c. Continue ACEI and statin. Lab Results  Component Value Date   HGBA1C 9.7 01/07/2016   Dysphagia Pending FEES study to help determine the least restrictive and safest diet for patient. Aspiration precautions to be taken for now.  Pressure ulcer risk Had suspected deep tissue injury to her feet. This has resolved. Discontinue Decubivite.  Hypertension Blood pressure readings stable on review.  Continue her ace inhibitor current regimen  Agitation Per nursing this is new for few days. Concerns for urinary tract infection producing with similar behavior changes in the past while having a urine infection. Send urine sample for study. Monitor for fever.  Schizophrenia Stable mood. Followed by psychiatric. Continue Invega.   Oneal GroutMAHIMA Jovanna Hodges, MD Internal Medicine [facility] [address] [place], [place] [address] Cell Phone (Monday-Friday 8 am - 5 pm): [phone] On Call: [phone] and follow prompts after 5 pm and on weekends Office Phone: [phone] Office Fax: [phone]

## 2016-02-11 LAB — CBC AND DIFFERENTIAL
HEMATOCRIT: 34 % — AB (ref 36–46)
Hemoglobin: 10.5 g/dL — AB (ref 12.0–16.0)
PLATELETS: 205 10*3/uL (ref 150–399)
WBC: 9.1 10*3/mL

## 2016-02-12 ENCOUNTER — Non-Acute Institutional Stay (SKILLED_NURSING_FACILITY): Payer: Medicare Other | Admitting: Internal Medicine

## 2016-02-12 ENCOUNTER — Encounter: Payer: Self-pay | Admitting: Internal Medicine

## 2016-02-12 DIAGNOSIS — G250 Essential tremor: Secondary | ICD-10-CM | POA: Diagnosis not present

## 2016-02-12 DIAGNOSIS — E785 Hyperlipidemia, unspecified: Secondary | ICD-10-CM | POA: Diagnosis not present

## 2016-02-12 DIAGNOSIS — F411 Generalized anxiety disorder: Secondary | ICD-10-CM

## 2016-02-12 NOTE — Progress Notes (Signed)
Patient ID: [name], female   DOB: April 16, 1929, 83 y.o.   MRN: [id number]      [facility]   Code Status:  Full Code   Allergies  Allergen Reactions  . Rocephin [Ceftriaxone Sodium In Dextrose]     [facility]      Chief Complaint  Patient presents with  . Medical Management of Chronic Issues    Routine Visit    Advanced Directives 06/18/2015  Does Patient Have a Medical Advance Directive? No    HPI 83 y/o female patient is seen for routine visit. She denies any concerns this visit. No new concern from nursing. She is sitting on her wheelchair. She does not participate in history of present illness and review of system.  [id number]:unable to obtain.    Past medical history reviewed   Medication reviewed. See MAR Allergies as of 02/12/2016      Reactions   Rocephin [ceftriaxone Sodium In Dextrose]    [facility]       Medication List       Accurate as of 02/12/16  2:22 PM. Always use your most recent med list.          acetaminophen 325 MG tablet Commonly known as:  TYLENOL Take 650 mg by mouth every 4 (four) hours as needed for mild pain.   AMBULATORY NON FORMULARY MEDICATION Medication Name: Med Pass 90 mL twice daily for nutritional supplement   atorvastatin 10 MG tablet Commonly known as:  LIPITOR Take 10 mg by mouth daily.   benazepril 5 MG tablet Commonly known as:  LOTENSIN Take 5 mg by mouth daily.   cholecalciferol 1000 units tablet Commonly known as:  VITAMIN D Take 1,000 Units by mouth daily. For Senile Osteoporosis   clonazePAM 0.5 MG tablet Commonly known as:  KLONOPIN Take 0.25 mg by mouth 2 (two) times daily.   donepezil 10 MG tablet Commonly known as:  ARICEPT Take 10 mg by mouth at bedtime.   insulin glargine 100 UNIT/ML injection Commonly known as:  LANTUS Inject 32-35 Units into the skin 2 (two) times daily. Inject 35 units subcutaneously every morning and inject 32 units  subcutaneously every evening to control blood sugar   INVEGA SUSTENNA 117 MG/0.75ML Susp Generic drug:  Paliperidone Palmitate Inject 0.2675ml intramuscularly once a month on the 4th for schizaffective disorder   Lavender Oil Oil Apply 1 drop topically at bedtime as needed (for relaxation). Apply 1 drop behind each ear every night at bedtime as needed for relaxation   memantine 28 MG Cp24 24 hr capsule Commonly known as:  NAMENDA XR Take 28 mg by mouth daily.   mirtazapine 15 MG tablet Commonly known as:  REMERON Take 15 mg by mouth daily.   ondansetron 4 MG disintegrating tablet Commonly known as:  ZOFRAN ODT Take 1 tablet (4 mg total) by mouth every 12 (twelve) hours as needed for nausea or vomiting.   OYST-CAL 500 MG Tabs Generic drug:  Oyster Shell Calcium Take 1 tablet by mouth 2 (two) times daily.   primidone 50 MG tablet Commonly known as:  MYSOLINE Take 50 mg by mouth 3 (three) times daily.   SYSTANE FREE OP Place 1 drop into both eyes 3 (three) times daily.   UNABLE TO FIND Med Name: Magic Cup daily at 2 pm with Medpass   UTI-STAT Liqd Take 30 mLs by mouth every morning.   zinc oxide 20 % ointment Apply  1 application topically daily as needed for irritation.       Physical exam BP 131/64   Pulse 97   Temp 98.3 F (36.8 C) (Oral)   Resp 20   Ht 5\' 1"  (1.549 m)   Wt 139 lb 12.8 oz (63.4 kg)   SpO2 96%   BMI 26.41 kg/m   Wt Readings from Last 3 Encounters:  02/12/16 139 lb 12.8 oz (63.4 kg)  01/15/16 136 lb 3.2 oz (61.8 kg)  11/25/15 134 lb 9.6 oz (61.1 kg)   General- elderly female, overweight, in no acute distress Head- atraumatic, normocephalic Eyes- no pallor, no icterus, no discharge Neck- no lymphadenopathy Mouth- normal mucus membrane, edentulous Cardiovascular- normal s1,s2, systolic murmur +, leg edema +, diminished pedal pulses Respiratory- bilateral clear to auscultation Abdomen- bowel sounds present, soft, non  tender Musculoskeletal- able to move all 4 extremities, no contractures, non ambulatory, modified shoe for shortened LLE, able to propel herself in wheelchair for short distances, hoyer lift transfers Neurological- one word answer at times, mostly non verbal Skin- warm and dry Psychiatry- pleasantly confused, oriented to self   Labs CBC Latest Ref Rng & Units 06/18/2015 05/28/2015 04/17/2015  WBC 4.0 - 10.5 K/uL 7.9 8.8 9.6  Hemoglobin 12.0 - 15.0 g/dL 10.9(L) 10.5(A) 10.8(A)  Hematocrit 36.0 - 46.0 % 34.7(L) 35(A) 34(A)  Platelets 150 - 400 K/uL 237 214 210   CMP Latest Ref Rng & Units 11/18/2015 09/23/2015 06/18/2015  Glucose 65 - 99 mg/dL - - 161(W)  BUN 4 - 21 mg/dL 17 96(E) 15  Creatinine 0.5 - 1.1 mg/dL 0.7 0.9 4.54  Sodium 098 - 147 mmol/L 139 143 137  Potassium 3.4 - 5.3 mmol/L 4.1 4.1 4.0  Chloride 101 - 111 mmol/L - - 106  CO2 22 - 32 mmol/L - - 24  Calcium 8.9 - 10.3 mg/dL - - 8.3(L)  Total Protein 6.0 - 8.3 g/dL - - -  Total Bilirubin 0.3 - 1.2 mg/dL - - -  Alkaline Phos 25 - 125 U/L 102 - -  AST 13 - 35 U/L 14 - -  ALT 7 - 35 U/L 14 - -   Lab Results  Component Value Date   HGBA1C 9.7 01/07/2016   Lipid Panel     Component Value Date/Time   CHOL 173 11/18/2015   TRIG 198 (A) 11/18/2015   HDL 46 11/18/2015   CHOLHDL 4.7 09/19/2008 1845   VLDL 57 (H) 09/19/2008 1845   LDLCALC 88 11/18/2015   08/25/14 microalbuminuria present   ASSESSMENT/PLAN  GAD Stable, continue clonazepam 0.25 mg bid and monitor  Hyperlipidemia Lipid Panel     Component Value Date/Time   CHOL 173 11/18/2015   TRIG 198 (A) 11/18/2015   HDL 46 11/18/2015   CHOLHDL 4.7 09/19/2008 1845   VLDL 57 (H) 09/19/2008 1845   LDLCALC 88 11/18/2015   LDL at goal. Continue atorvastatin 10 mg daily  Essential tremors Continue mysoline, stable, no changes made   Oneal Grout, MD Internal Medicine [facility] [address] [place], [place]  [address] Cell Phone (Monday-Friday 8 am - 5 pm): [phone] On Call: [phone] and follow prompts after 5 pm and on weekends Office Phone: [phone] Office Fax: [phone]

## 2016-05-18 ENCOUNTER — Encounter: Payer: Self-pay | Admitting: Internal Medicine

## 2016-05-18 ENCOUNTER — Non-Acute Institutional Stay (SKILLED_NURSING_FACILITY): Payer: Medicare Other | Admitting: Internal Medicine

## 2016-05-18 DIAGNOSIS — E1165 Type 2 diabetes mellitus with hyperglycemia: Secondary | ICD-10-CM

## 2016-05-18 DIAGNOSIS — Z794 Long term (current) use of insulin: Secondary | ICD-10-CM

## 2016-05-18 DIAGNOSIS — E782 Mixed hyperlipidemia: Secondary | ICD-10-CM

## 2016-05-18 DIAGNOSIS — R1312 Dysphagia, oropharyngeal phase: Secondary | ICD-10-CM | POA: Diagnosis not present

## 2016-05-18 DIAGNOSIS — E1122 Type 2 diabetes mellitus with diabetic chronic kidney disease: Secondary | ICD-10-CM

## 2016-05-18 DIAGNOSIS — E1129 Type 2 diabetes mellitus with other diabetic kidney complication: Secondary | ICD-10-CM

## 2016-05-18 DIAGNOSIS — R809 Proteinuria, unspecified: Secondary | ICD-10-CM

## 2016-05-18 DIAGNOSIS — IMO0002 Reserved for concepts with insufficient information to code with codable children: Secondary | ICD-10-CM

## 2016-05-18 DIAGNOSIS — N183 Chronic kidney disease, stage 3 (moderate): Secondary | ICD-10-CM

## 2016-05-18 NOTE — Progress Notes (Signed)
Patient ID: Marisa Scott, female   DOB: July 18, 1929, 83 y.o.   MRN: [id number]      [facility]   Code Status:  Full Code   Allergies  Allergen Reactions  . Rocephin [Ceftriaxone Sodium In Dextrose]     Unknown reaction per [facility]      Chief Complaint  Patient presents with  . Medical Management of Chronic Issues    Routine Visit    Advanced Directives 06/18/2015  Does Patient Have a Medical Advance Directive? No    HPI 83 y/o female patient is seen for routine visit. She denies any concerns this visit. Reviewed her cbg and BP readings. She does not participate in history of present illness and review of system.  [id number] to obtain.    Past medical history reviewed   Medication reviewed. See MAR Allergies as of 05/18/2016      Reactions   Rocephin [ceftriaxone Sodium In Dextrose]    Unknown reaction per [facility]       Medication List       Accurate as of 05/18/16  1:19 PM. Always use your most recent med list.          acetaminophen 325 MG tablet Commonly known as:  TYLENOL Take 650 mg by mouth every 4 (four) hours as needed for mild pain.   AMBULATORY NON FORMULARY MEDICATION Medication Name: Med Pass 90 mL twice daily for nutritional supplement   atorvastatin 10 MG tablet Commonly known as:  LIPITOR Take 10 mg by mouth daily.   benazepril 5 MG tablet Commonly known as:  LOTENSIN Take 5 mg by mouth daily.   cholecalciferol 1000 units tablet Commonly known as:  VITAMIN D Take 1,000 Units by mouth daily. For Senile Osteoporosis   clonazePAM 0.5 MG tablet Commonly known as:  KLONOPIN Take 0.25 mg by mouth 2 (two) times daily.   donepezil 10 MG tablet Commonly known as:  ARICEPT Take 10 mg by mouth at bedtime.   insulin glargine 100 UNIT/ML injection Commonly known as:  LANTUS Inject 35 Units into the skin 2 (two) times daily.   INVEGA SUSTENNA 117 MG/0.75ML Susp Generic drug:  Paliperidone Palmitate Inject 0.65ml  intramuscularly once a month on the 4th for schizaffective disorder   Lavender Oil Oil Apply 1 drop topically at bedtime as needed (for relaxation). Apply 1 drop behind each ear every night at bedtime as needed for relaxation   memantine 28 MG Cp24 24 hr capsule Commonly known as:  NAMENDA XR Take 28 mg by mouth daily.   mirtazapine 15 MG tablet Commonly known as:  REMERON Take 15 mg by mouth daily.   ondansetron 4 MG disintegrating tablet Commonly known as:  ZOFRAN ODT Take 1 tablet (4 mg total) by mouth every 12 (twelve) hours as needed for nausea or vomiting.   OYST-CAL 500 MG Tabs Generic drug:  Oyster Shell Calcium Take 1 tablet by mouth 2 (two) times daily.   primidone 50 MG tablet Commonly known as:  MYSOLINE Take 50 mg by mouth 3 (three) times daily.   SYSTANE FREE OP Place 1 drop into both eyes 3 (three) times daily.   UNABLE TO FIND Med Name: Magic Cup daily at 2 pm with Medpass   UTI-STAT Liqd Take 30 mLs by mouth every morning.   zinc oxide 20 % ointment Apply 1 application topically daily as needed for irritation.       Physical exam BP (!) 145/62   Pulse  93   Temp 97 F (36.1 C) (Oral)   Resp 20   Ht  (1.549 m)   Wt 143 lb (64.9 kg)   SpO2 97%   BMI 27.02 kg/m   Wt Readings from Last 3 Encounters:  05/18/16 143 lb (64.9 kg)  02/12/16 139 lb 12.8 oz (63.4 kg)  01/15/16 136 lb 3.2 oz (61.8 kg)   General- elderly female, overweight, in no acute distress Head- atraumatic, normocephalic Eyes- no pallor, no icterus, no discharge Neck- no lymphadenopathy Mouth- normal mucus membrane, edentulous Cardiovascular- normal s1,s2, systolic murmur +, leg edema +, diminished pedal pulses Respiratory- bilateral clear to auscultation Abdomen- bowel sounds present, soft, non tender Musculoskeletal- able to move all 4 extremities, no contractures, non ambulatory, modified shoe for shortened LLE, able to propel herself in wheelchair for short distances,  hoyer lift transfers Neurological- one word answer at times, mostly non verbal Skin- warm and dry Psychiatry- pleasantly confused, oriented to self   Labs CBC Latest Ref Rng & Units 02/11/2016 06/18/2015 05/28/2015  WBC 10:3/mL 9.1 7.9 8.8  Hemoglobin 12.0 - 16.0 g/dL 10.5(A) 10.9(L) 10.5(A)  Hematocrit 36 - 46 % 34(A) 34.7(L) 35(A)  Platelets 150 - 399 K/L 205 237 214   CMP Latest Ref Rng & Units 11/18/2015 09/23/2015 06/18/2015  Glucose 65 - 99 mg/dL - - 098(J)  BUN 4 - 21 mg/dL 17 19(J) 15  Creatinine 0.5 - 1.1 mg/dL 0.7 0.9 4.78  Sodium 295 - 147 mmol/L 139 143 137  Potassium 3.4 - 5.3 mmol/L 4.1 4.1 4.0  Chloride 101 - 111 mmol/L - - 106  CO2 22 - 32 mmol/L - - 24  Calcium 8.9 - 10.3 mg/dL - - 8.3(L)  Total Protein 6.0 - 8.3 g/dL - - -  Total Bilirubin 0.3 - 1.2 mg/dL - - -  Alkaline Phos 25 - 125 U/L 102 - -  AST 13 - 35 U/L 14 - -  ALT 7 - 35 U/L 14 - -   Lab Results  Component Value Date   HGBA1C 9.7 01/07/2016   Lipid Panel     Component Value Date/Time   CHOL 173 11/18/2015   TRIG 198 (A) 11/18/2015   HDL 46 11/18/2015   CHOLHDL 4.7 09/19/2008 1845   VLDL 57 (H) 09/19/2008 1845   LDLCALC 88 11/18/2015   08/25/14 microalbuminuria present   ASSESSMENT/PLAN  Type 2 DM with nephropathy Lab Results  Component Value Date   HGBA1C 9.7 01/07/2016   Reviewed cbg, currently on lantus 35 u bid. Continue atorvastatin and benazepril. Check a1c.  Microalbuminuria With DM. Continue benazepril 5 mg daily for renal protection  Hyperlipidemia Lipid Panel     Component Value Date/Time   CHOL 173 11/18/2015   TRIG 198 (A) 11/18/2015   HDL 46 11/18/2015   CHOLHDL 4.7 09/19/2008 1845   VLDL 57 (H) 09/19/2008 1845   LDLCALC 88 11/18/2015   Check lipid panel. Continue atorvastatin 10 mg daily  Dysphagia With dementia with high aspiration risk, continue with pureed feed and thickened liquids.    Oneal Grout, MD Internal Medicine [facility] [address] [place], [place] [address] Cell Phone (Monday-Friday 8 am - 5 pm): [phone] On Call: [phone] and follow prompts after 5 pm and on weekends Office Phone: [phone] Office Fax: [phone]

## 2016-11-24 ENCOUNTER — Other Ambulatory Visit (HOSPITAL_COMMUNITY): Payer: Self-pay | Admitting: Family Medicine

## 2016-11-24 DIAGNOSIS — R1319 Other dysphagia: Secondary | ICD-10-CM

## 2016-12-01 ENCOUNTER — Ambulatory Visit (HOSPITAL_COMMUNITY): Payer: Medicare Other

## 2017-09-13 ENCOUNTER — Encounter: Payer: Self-pay | Admitting: Internal Medicine

## 2018-07-02 DEATH — deceased
